# Patient Record
Sex: Female | Born: 1975 | Race: White | Hispanic: No | Marital: Married | Smoking: Never smoker
Health system: Southern US, Community
[De-identification: ages and names within clinical notes are randomized; demographics above are authoritative.]

## PROBLEM LIST (undated history)

## (undated) DIAGNOSIS — N939 Abnormal uterine and vaginal bleeding, unspecified: Secondary | ICD-10-CM

## (undated) DIAGNOSIS — D649 Anemia, unspecified: Secondary | ICD-10-CM

## (undated) DIAGNOSIS — N83209 Unspecified ovarian cyst, unspecified side: Secondary | ICD-10-CM

## (undated) DIAGNOSIS — R42 Dizziness and giddiness: Secondary | ICD-10-CM

## (undated) HISTORY — DX: Abnormal uterine and vaginal bleeding, unspecified: N93.9

## (undated) HISTORY — PX: WISDOM TOOTH EXTRACTION: SHX21

## (undated) HISTORY — DX: Dizziness and giddiness: R42

## (undated) HISTORY — DX: Anemia, unspecified: D64.9

## (undated) HISTORY — PX: ABDOMINAL HYSTERECTOMY: SHX81

---

## 2004-06-06 ENCOUNTER — Ambulatory Visit: Payer: Self-pay | Admitting: General Practice

## 2005-03-27 ENCOUNTER — Ambulatory Visit (HOSPITAL_COMMUNITY): Admission: RE | Admit: 2005-03-27 | Discharge: 2005-03-27 | Payer: Self-pay | Admitting: Obstetrics & Gynecology

## 2006-08-15 ENCOUNTER — Inpatient Hospital Stay (HOSPITAL_COMMUNITY): Admission: AD | Admit: 2006-08-15 | Discharge: 2006-08-18 | Payer: Self-pay | Admitting: Obstetrics & Gynecology

## 2006-08-15 ENCOUNTER — Inpatient Hospital Stay (HOSPITAL_COMMUNITY): Admission: AD | Admit: 2006-08-15 | Discharge: 2006-08-15 | Payer: Self-pay | Admitting: Obstetrics and Gynecology

## 2017-06-18 ENCOUNTER — Encounter (HOSPITAL_COMMUNITY): Payer: Self-pay | Admitting: Emergency Medicine

## 2017-06-18 DIAGNOSIS — R42 Dizziness and giddiness: Secondary | ICD-10-CM | POA: Insufficient documentation

## 2017-06-18 LAB — I-STAT BETA HCG BLOOD, ED (MC, WL, AP ONLY)

## 2017-06-18 LAB — CBC
HCT: 39.4 % (ref 36.0–46.0)
Hemoglobin: 12.9 g/dL (ref 12.0–15.0)
MCH: 27.4 pg (ref 26.0–34.0)
MCHC: 32.7 g/dL (ref 30.0–36.0)
MCV: 83.8 fL (ref 78.0–100.0)
Platelets: 301 10*3/uL (ref 150–400)
RBC: 4.7 MIL/uL (ref 3.87–5.11)
RDW: 14.1 % (ref 11.5–15.5)
WBC: 11.9 10*3/uL — ABNORMAL HIGH (ref 4.0–10.5)

## 2017-06-18 LAB — URINALYSIS, ROUTINE W REFLEX MICROSCOPIC
BACTERIA UA: NONE SEEN
Bilirubin Urine: NEGATIVE
GLUCOSE, UA: NEGATIVE mg/dL
Ketones, ur: NEGATIVE mg/dL
Leukocytes, UA: NEGATIVE
NITRITE: NEGATIVE
PROTEIN: NEGATIVE mg/dL
SPECIFIC GRAVITY, URINE: 1.014 (ref 1.005–1.030)
pH: 7 (ref 5.0–8.0)

## 2017-06-18 LAB — BASIC METABOLIC PANEL
Anion gap: 14 (ref 5–15)
BUN: 15 mg/dL (ref 6–20)
CALCIUM: 9.7 mg/dL (ref 8.9–10.3)
CO2: 24 mmol/L (ref 22–32)
CREATININE: 0.85 mg/dL (ref 0.44–1.00)
Chloride: 103 mmol/L (ref 101–111)
GFR calc Af Amer: >60 mL/min (ref >60)
GLUCOSE: 110 mg/dL — AB (ref 65–99)
Potassium: 3.6 mmol/L (ref 3.5–5.1)
Sodium: 141 mmol/L (ref 135–145)

## 2017-06-18 NOTE — ED Triage Notes (Signed)
Patient here from home with complaints of intermittent "dizzy spells". Also reports nausea, vomiting during this time. Denies ear pain and chest pain.

## 2017-06-18 NOTE — ED Notes (Signed)
EKG given to EDP,Allen,MD., for review. 

## 2017-06-19 ENCOUNTER — Emergency Department (HOSPITAL_COMMUNITY)
Admission: EM | Admit: 2017-06-19 | Discharge: 2017-06-19 | Disposition: A | Discharge status: Home / Self Care | Payer: Self-pay | Attending: Emergency Medicine | Admitting: Emergency Medicine

## 2017-06-19 DIAGNOSIS — R42 Dizziness and giddiness: Secondary | ICD-10-CM

## 2017-06-19 MED ORDER — MECLIZINE HCL 25 MG PO TABS: 25.0000 mg | ORAL_TABLET | Freq: Three times a day (TID) | ORAL | 0 refills | Status: DC | PRN

## 2017-06-19 NOTE — Discharge Instructions (Addendum)
Meclizine as prescribed as needed for dizziness.  Follow-up with your primary doctor if not improving in the next week, and return to the ER if symptoms significantly worsen or change.

## 2017-06-19 NOTE — ED Provider Notes (Signed)
[facility] DEPT Provider Note   CSN: [id number] Arrival date & time: 06/18/17  1839     History   Chief Complaint Chief Complaint  Patient presents with  . Dizziness    HPI Robin Meadows is a 42 y.o. female.  Patient is a 42 year old female with no significant past medical history.  She presents today for evaluation of dizziness.  This is been ongoing for the past 2 days.  Her dizziness is described as a spinning sensation and occurs intermittently.  It does change with change in position and turning her head.  She denies any chest pain or palpitations.  The history is provided by the patient.  Dizziness  Quality:  Head spinning Severity:  Moderate Onset quality:  Sudden Duration:  2 days Timing:  Intermittent Progression:  Unchanged Chronicity:  New Context: bending over and head movement   Relieved by:  Nothing Worsened by:  Turning head and movement Ineffective treatments:  Being still   History reviewed. No pertinent past medical history.  There are no active problems to display for this patient.   History reviewed. No pertinent surgical history.   OB History   None      Home Medications    Prior to Admission medications   Not on File    Family History No family history on file.  Social History Social History   Tobacco Use  . Smoking status: Never Smoker  . Smokeless tobacco: Never Used  Substance Use Topics  . Alcohol use: Never    Frequency: Never  . Drug use: Never     Allergies   Patient has no allergy information on record.   Review of Systems Review of Systems  Neurological: Positive for dizziness.  All other systems reviewed and are negative.    Physical Exam Updated Vital Signs BP 132/68 (BP Location: Right Arm)   Pulse 77   Temp 98.5 F (36.9 C) (Oral)   Resp 16   SpO2 100%   Physical Exam  Constitutional: She is oriented to person, place, and time. She appears  well-developed and well-nourished. No distress.  HENT:  Head: Normocephalic and atraumatic.  Mouth/Throat: Oropharynx is clear and moist.  TMs are clear bilaterally.  Neck: Normal range of motion. Neck supple.  Cardiovascular: Normal rate and regular rhythm. Exam reveals no gallop and no friction rub.  No murmur heard. Pulmonary/Chest: Effort normal and breath sounds normal. No respiratory distress. She has no wheezes.  Abdominal: Soft. Bowel sounds are normal. She exhibits no distension. There is no tenderness.  Musculoskeletal: Normal range of motion.  Lymphadenopathy:    She has no cervical adenopathy.  Neurological: She is alert and oriented to person, place, and time. No cranial nerve deficit. She exhibits normal muscle tone. Coordination normal.  Skin: Skin is warm and dry. She is not diaphoretic.  Nursing note and vitals reviewed.    ED Treatments / Results  Labs (all labs ordered are listed, but only abnormal results are displayed) Labs Reviewed  BASIC METABOLIC PANEL - Abnormal; Notable for the following components:      Result Value   Glucose, Bld 110 (*)    All other components within normal limits  CBC - Abnormal; Notable for the following components:   WBC 11.9 (*)    All other components within normal limits  URINALYSIS, ROUTINE W REFLEX MICROSCOPIC - Abnormal; Notable for the following components:   Hgb urine dipstick MODERATE (*)    Squamous Epithelial /  LPF 0-5 (*)    All other components within normal limits  I-STAT BETA HCG BLOOD, ED ([facility], [facility], [facility] ONLY)  CBG MONITORING, ED    EKG EKG Interpretation  Date/Time:  Tuesday June 18 2017 19:35:14 EDT Ventricular Rate:  83 PR Interval:    QRS Duration: 91 QT Interval:  365 QTC Calculation: 429 R Axis:   70 Text Interpretation:  Sinus rhythm Low voltage, precordial leads Baseline wander in lead(s) V2 Confirmed by Veryl Speak [phone]) on 06/19/2017 12:43:35 AM   Radiology No results  found.  Procedures Procedures (including critical care time)  Medications Ordered in ED Medications - No data to display   Initial Impression / Assessment and Plan / ED Course  I have reviewed the triage vital signs and the nursing notes.  Pertinent labs & imaging results that were available during my care of the patient were reviewed by me and considered in my medical decision making (see chart for details).  Patient presents with dizziness that I suspect is related to a peripheral vertigo.  Her symptoms are worse with movement and change in position and relieved with rest.  Her physical examination is unremarkable and laboratory studies are essentially normal.  I see no indication for imaging studies.  She will be treated with meclizine and follow-up as needed.  Final Clinical Impressions(s) / ED Diagnoses   Final diagnoses:  None    ED Discharge Orders    None       Veryl Speak, MD 06/19/17 [phone]

## 2019-05-10 ENCOUNTER — Encounter (HOSPITAL_COMMUNITY): Payer: Self-pay | Admitting: Emergency Medicine

## 2019-05-10 ENCOUNTER — Emergency Department (HOSPITAL_COMMUNITY): Payer: Self-pay

## 2019-05-10 ENCOUNTER — Other Ambulatory Visit: Payer: Self-pay

## 2019-05-10 ENCOUNTER — Emergency Department (HOSPITAL_COMMUNITY)
Admission: EM | Admit: 2019-05-10 | Discharge: 2019-05-10 | Disposition: A | Discharge status: Home / Self Care | Payer: Self-pay | Attending: Emergency Medicine | Admitting: Emergency Medicine

## 2019-05-10 DIAGNOSIS — N939 Abnormal uterine and vaginal bleeding, unspecified: Secondary | ICD-10-CM | POA: Insufficient documentation

## 2019-05-10 DIAGNOSIS — R1032 Left lower quadrant pain: Secondary | ICD-10-CM

## 2019-05-10 DIAGNOSIS — R102 Pelvic and perineal pain: Secondary | ICD-10-CM

## 2019-05-10 HISTORY — DX: Unspecified ovarian cyst, unspecified side: N83.209

## 2019-05-10 LAB — COMPREHENSIVE METABOLIC PANEL
ALT: 7 U/L (ref 0–44)
AST: 16 U/L (ref 15–41)
Albumin: 3.3 g/dL — ABNORMAL LOW (ref 3.5–5.0)
Alkaline Phosphatase: 80 U/L (ref 38–126)
Anion gap: 12 (ref 5–15)
BUN: 12 mg/dL (ref 6–20)
CO2: 22 mmol/L (ref 22–32)
Calcium: 9.2 mg/dL (ref 8.9–10.3)
Chloride: 104 mmol/L (ref 98–111)
Creatinine, Ser: 0.88 mg/dL (ref 0.44–1.00)
GFR calc Af Amer: >60 mL/min (ref >60)
GFR calc non Af Amer: >60 mL/min (ref >60)
Glucose, Bld: 119 mg/dL — ABNORMAL HIGH (ref 70–99)
Potassium: 3.5 mmol/L (ref 3.5–5.1)
Sodium: 138 mmol/L (ref 135–145)
Total Bilirubin: 0.6 mg/dL (ref 0.3–1.2)
Total Protein: 7.1 g/dL (ref 6.5–8.1)

## 2019-05-10 LAB — POC URINE PREG, ED: Preg Test, Ur: NEGATIVE

## 2019-05-10 LAB — CBC WITH DIFFERENTIAL/PLATELET
Abs Immature Granulocytes: 0.06 10*3/uL (ref 0.00–0.07)
Basophils Absolute: 0.1 10*3/uL (ref 0.0–0.1)
Basophils Relative: 1 %
Eosinophils Absolute: 0.3 10*3/uL (ref 0.0–0.5)
Eosinophils Relative: 3 %
HCT: 33.1 % — ABNORMAL LOW (ref 36.0–46.0)
Hemoglobin: 10 g/dL — ABNORMAL LOW (ref 12.0–15.0)
Immature Granulocytes: 1 %
Lymphocytes Relative: 22 %
Lymphs Abs: 2.4 10*3/uL (ref 0.7–4.0)
MCH: 24 pg — ABNORMAL LOW (ref 26.0–34.0)
MCHC: 30.2 g/dL (ref 30.0–36.0)
MCV: 79.6 fL — ABNORMAL LOW (ref 80.0–100.0)
Monocytes Absolute: 0.4 10*3/uL (ref 0.1–1.0)
Monocytes Relative: 4 %
Neutro Abs: 7.8 10*3/uL — ABNORMAL HIGH (ref 1.7–7.7)
Neutrophils Relative %: 69 %
Platelets: 292 10*3/uL (ref 150–400)
RBC: 4.16 MIL/uL (ref 3.87–5.11)
RDW: 14.1 % (ref 11.5–15.5)
WBC: 11.1 10*3/uL — ABNORMAL HIGH (ref 4.0–10.5)
nRBC: 0 % (ref 0.0–0.2)

## 2019-05-10 LAB — WET PREP, GENITAL
Sperm: NONE SEEN
Trich, Wet Prep: NONE SEEN
Yeast Wet Prep HPF POC: NONE SEEN

## 2019-05-10 NOTE — ED Triage Notes (Signed)
Pt reports vaginal bleeding x 2 months.  Lower back and generalized abd pain x 1 week.  Seen at [facility] and diagnosed with L ovarian cyst.

## 2019-05-10 NOTE — ED Notes (Signed)
Patient verbalizes understanding of discharge instructions. Opportunity for questioning and answers were provided. Armband removed by staff, pt discharged from ED ambulatory to home.  

## 2019-05-10 NOTE — Discharge Instructions (Signed)
Please read and follow all provided instructions.  Your diagnoses today include:  1. Vaginal bleeding   2. LLQ pain   3. Pelvic pain     Tests performed today include:  Hemoglobin was a little low  Ultrasound -again shows thickened endometrium, no ovarian cyst today  Vital signs. See below for your results today.   Medications prescribed:   None  Take any prescribed medications only as directed.  Home care instructions:  Follow any educational materials contained in this packet.  BE VERY CAREFUL not to take multiple medicines containing Tylenol (also called acetaminophen). Doing so can lead to an overdose which can damage your liver and cause liver failure and possibly death.   Follow-up instructions: Please follow-up with the OB/GYN listed.  Call tomorrow and schedule appointment.  Return instructions:   Please return to the Emergency Department if you experience worsening symptoms.   Return with heavier bleeding, chest pain, shortness of breath, lightheadedness or if you pass out.  Please return if you have any other emergent concerns.  Additional Information:  Your vital signs today were: BP 124/63   Pulse 79   Temp 98.5 F (36.9 C) (Oral)   Resp 16   Ht 5\' 11"  (1.803 m)   Wt (!) 142.9 kg   LMP 03/09/2019   SpO2 100%   BMI 43.93 kg/m  If your blood pressure (BP) was elevated above 135/85 this visit, please have this repeated by your doctor within one month. --------------

## 2019-05-10 NOTE — ED Notes (Signed)
Patient transported to Ultrasound 

## 2019-05-10 NOTE — ED Provider Notes (Signed)
[facility] EMERGENCY DEPARTMENT Provider Note   CSN: [id number] Arrival date & time: 05/10/19  1019     History Chief Complaint  Patient presents with  . Vaginal Bleeding  . Abdominal Pain    Robin Meadows is a 44 y.o. female.  Patient with no previous surgical history presents the emergency department with complaint of ongoing vaginal bleeding and abdominal pain.  Patient states that she has had irregular periods, bleeding twice a month, for about 10 months.  She has had 2 months of persistent daily bleeding.  She has followed up with her PCP at [facility] and states that she had a growth on the left ovary as well as a thickened endometrium on ultrasound.  Plan was to follow-up with OB/GYN and patient was referred.  She states that she has not heard back regarding follow-up for about 3 weeks.  She was placed on 10 days of progesterone, finished about a week ago.  This helped slow the bleeding but it is not stopped.  Currently she is replacing a small pad every 2-3 hours.  No lightheadedness, syncope, shortness of breath or chest pain.  She does have some fatigue.  Recent negative pregnancy test.  Patient has also developed some lower back pain and mainly left-sided abdominal pain over the past week.  This is exacerbated by eating.  In addition, patient noticed some right-sided abdominal pain in the past day.        Past Medical History:  Diagnosis Date  . Ovarian cyst     There are no problems to display for this patient.   History reviewed. No pertinent surgical history.   OB History   No obstetric history on file.     No family history on file.  Social History   Tobacco Use  . Smoking status: Never Smoker  . Smokeless tobacco: Never Used  Substance Use Topics  . Alcohol use: Never  . Drug use: Never    Home Medications Prior to Admission medications   Medication Sig Start Date End Date Taking? Authorizing Provider    meclizine (ANTIVERT) 25 MG tablet Take 1 tablet (25 mg total) by mouth 3 (three) times daily as needed for dizziness. 06/19/17   Veryl Speak, MD    Allergies    Patient has no allergy information on record.  Review of Systems   Review of Systems  Constitutional: Positive for fatigue. Negative for fever.  HENT: Negative for rhinorrhea and sore throat.   Eyes: Negative for redness.  Respiratory: Negative for cough and shortness of breath.   Cardiovascular: Negative for chest pain.  Gastrointestinal: Positive for abdominal pain. Negative for diarrhea, nausea and vomiting.  Genitourinary: Positive for vaginal bleeding. Negative for dysuria and vaginal discharge.  Musculoskeletal: Negative for myalgias.  Skin: Negative for rash.  Neurological: Negative for headaches.    Physical Exam Updated Vital Signs BP (!) 122/54   Pulse 88   Temp 98.5 F (36.9 C) (Oral)   Resp 16   Ht 5\' 11"  (1.803 m)   Wt (!) 142.9 kg   LMP 03/09/2019   SpO2 100%   BMI 43.93 kg/m   Physical Exam Vitals and nursing note reviewed.  Constitutional:      Appearance: She is well-developed.  HENT:     Head: Normocephalic and atraumatic.  Eyes:     General:        Right eye: No discharge.        Left eye: No  discharge.     Conjunctiva/sclera: Conjunctivae normal.  Cardiovascular:     Rate and Rhythm: Normal rate and regular rhythm.     Heart sounds: Normal heart sounds.  Pulmonary:     Effort: Pulmonary effort is normal.     Breath sounds: Normal breath sounds.  Abdominal:     Palpations: Abdomen is soft.     Tenderness: There is no abdominal tenderness.  Genitourinary:    Vagina: Bleeding present. No vaginal discharge.     Cervix: No cervical motion tenderness.     Uterus: Normal. Not tender.      Adnexa: Right adnexa normal and left adnexa normal.       Right: No tenderness.         Left: No tenderness.    Musculoskeletal:     Cervical back: Normal range of motion and neck supple.  Skin:     General: Skin is warm and dry.  Neurological:     Mental Status: She is alert.     ED Results / Procedures / Treatments   Labs (all labs ordered are listed, but only abnormal results are displayed) Labs Reviewed  WET PREP, GENITAL - Abnormal; Notable for the following components:      Result Value   Clue Cells Wet Prep HPF POC PRESENT (*)    WBC, Wet Prep HPF POC MANY (*)    All other components within normal limits  CBC WITH DIFFERENTIAL/PLATELET - Abnormal; Notable for the following components:   WBC 11.1 (*)    Hemoglobin 10.0 (*)    HCT 33.1 (*)    MCV 79.6 (*)    MCH 24.0 (*)    Neutro Abs 7.8 (*)    All other components within normal limits  COMPREHENSIVE METABOLIC PANEL - Abnormal; Notable for the following components:   Glucose, Bld 119 (*)    Albumin 3.3 (*)    All other components within normal limits  URINALYSIS, ROUTINE W REFLEX MICROSCOPIC  POC URINE PREG, ED  GC/CHLAMYDIA PROBE AMP ([place]) NOT AT [facility]    EKG None  Radiology US PELVIC COMPLETE W TRANSVAGINAL AND TORSION R/O  Result Date: 05/10/2019 CLINICAL DATA:  Left lower quadrant abdominal pain and pelvic pain for 1 week, vaginal bleeding for 2 months. EXAM: TRANSABDOMINAL AND TRANSVAGINAL ULTRASOUND OF PELVIS DOPPLER ULTRASOUND OF OVARIES TECHNIQUE: Both transabdominal and transvaginal ultrasound examinations of the pelvis were performed. Transabdominal technique was performed for global imaging of the pelvis including uterus, ovaries, adnexal regions, and pelvic cul-de-sac. It was necessary to proceed with endovaginal exam following the transabdominal exam to visualize the adnexa and endometrium. Color and duplex Doppler ultrasound was utilized to evaluate blood flow to the ovaries. COMPARISON:  Hysterosalpingogram dated 03/27/2005 FINDINGS: Uterus Measurements: 14.6 x 7.2 x 9.0 cm = volume: 498 mL. The uterus is enlarged. No fibroids or other mass visualized. Endometrium Thickness: 22 mm.  The  endometrium is thickened. Right ovary Not visualized.  No adnexal mass is identified. Left ovary Measurements: 2.4 x 4.5 x 6.0 = volume: 104 mL. Normal appearance/no adnexal mass. Pulsed Doppler evaluation of both ovaries demonstrates normal low-resistance arterial and venous waveforms. Other findings No abnormal free fluid. IMPRESSION: Endometrial thickness is considered abnormal. Consider follow-up by [place] in 6-8 weeks, during the week immediately following menses (exam timing is critical). Electronically Signed   By: Zerita Boers M.D.   On: 05/10/2019 14:33    Procedures Procedures (including critical care time)  Medications Ordered in ED Medications -  No data to display  ED Course  I have reviewed the triage vital signs and the nursing notes.  Pertinent labs & imaging results that were available during my care of the patient were reviewed by me and considered in my medical decision making (see chart for details).  Patient seen and examined.   Vital signs reviewed and are as follows: BP (!) 122/54   Pulse 88   Temp 98.5 F (36.9 C) (Oral)   Resp 16   Ht 5\' 11"  (1.803 m)   Wt (!) 142.9 kg   LMP 03/09/2019   SpO2 100%   BMI 43.93 kg/m   Pelvic exam performed. I do not have imaging or records from [place].   Given reported abnormalities -- will repeat US here today.  Hemoglobin is reassuring.  Orthostatic VS for the past 24 hrs:  BP- Lying Pulse- Lying BP- Sitting Pulse- Sitting BP- Standing at 0 minutes Pulse- Standing at 0 minutes  05/10/19 1110 119/71 89 133/86 104 132/78 90    3:17 PM [place] with thickened endometrium. No ovarian issues, right side not visible, no mass.   The patient was urged to return to the Emergency Department immediately with worsening of current symptoms, worsening abdominal pain, persistent vomiting, blood noted in stools, fever, or any other concerns. The patient verbalized understanding.      MDM Rules/Calculators/A&P                      Patient  with ongoing vaginal bleeding, abdominal pain.  Neg preg.  Hemoglobin is 10.  Mild bleeding on exam.  Ultrasound with thickened endometrium, no ovarian findings today.  Patient will need follow-up with OB/GYN.  Referral given.  Non-focal abd pain at time of exam today. WBC 11.1. Vitals are stable, no fever. No signs of dehydration, patient is tolerating PO's. Lungs are clear and no signs suggestive of PNA. Low concern for appendicitis, cholecystitis, pancreatitis, ruptured viscus, UTI, kidney stone, aortic dissection, aortic aneurysm or other emergent abdominal etiology. Supportive therapy indicated with return if symptoms worsen.    Final Clinical Impression(s) / ED Diagnoses Final diagnoses:  Vaginal bleeding    Rx / DC Orders ED Discharge Orders    None       Carlisle Cater, PA-C 05/10/19 1520    Lucrezia Starch, MD 05/12/19 [phone]

## 2019-05-12 LAB — GC/CHLAMYDIA PROBE AMP (~~LOC~~) NOT AT ARMC
Chlamydia: NEGATIVE
Neisseria Gonorrhea: NEGATIVE

## 2019-05-19 ENCOUNTER — Ambulatory Visit (INDEPENDENT_AMBULATORY_CARE_PROVIDER_SITE_OTHER): Payer: BC Managed Care – PPO | Admitting: Obstetrics and Gynecology

## 2019-05-19 ENCOUNTER — Encounter: Payer: Self-pay | Admitting: Obstetrics and Gynecology

## 2019-05-19 ENCOUNTER — Other Ambulatory Visit: Payer: Self-pay

## 2019-05-19 ENCOUNTER — Other Ambulatory Visit (HOSPITAL_COMMUNITY)
Admission: RE | Admit: 2019-05-19 | Discharge: 2019-05-19 | Disposition: A | Discharge status: Home / Self Care | Payer: BC Managed Care – PPO | Source: Ambulatory Visit | Attending: Obstetrics and Gynecology | Admitting: Obstetrics and Gynecology

## 2019-05-19 VITALS — BP 122/80 | HR 96 | Temp 97.0°F | Resp 16 | Ht 69.0 in | Wt 311.8 lb

## 2019-05-19 DIAGNOSIS — R9389 Abnormal findings on diagnostic imaging of other specified body structures: Secondary | ICD-10-CM | POA: Diagnosis not present

## 2019-05-19 DIAGNOSIS — Z124 Encounter for screening for malignant neoplasm of cervix: Secondary | ICD-10-CM | POA: Diagnosis not present

## 2019-05-19 DIAGNOSIS — N939 Abnormal uterine and vaginal bleeding, unspecified: Secondary | ICD-10-CM | POA: Diagnosis not present

## 2019-05-19 DIAGNOSIS — N83202 Unspecified ovarian cyst, left side: Secondary | ICD-10-CM | POA: Diagnosis not present

## 2019-05-19 MED ORDER — METRONIDAZOLE 500 MG PO TABS: 500.0000 mg | ORAL_TABLET | Freq: Two times a day (BID) | ORAL | 0 refills | Status: DC

## 2019-05-19 NOTE — Progress Notes (Signed)
44 y.o. G50P1021 Married Caucasian female here for abnormal uterine bleeding and abnormal PUS.    States menstrual bleeding since April 2020, has occurred every other week.  Patient has been on menses continually since 02-19-19.  Had PUS 05-10-19 ordered by [facility] showing possible ovarian cyst and endometrial thickening. Her TSH was 2.114, Hgb 9.8, ferritin 6.5. Patient received course of progesterone, which helped the bleeding, but did not stop it.  Last Provera was 04/30/19. She has had clotting. As of yesterday is now spotting only.   She developed lower abdominal and back pain 05/08/19 in addition to the ongoing bleeding, and this prompted her visit to [facility] ER on 05/10/19.  States she took Pamprin for the pain.  Her hemoglobin was 10.0.  She started iron 65 mg for the anemia, which has helped the fatigue and sleepiness. Neg GC/CT. She tested positive for bacterial vaginosis and did not have treatment.  Negative UPT.   Pelvic US at the hospital showed:  CLINICAL DATA:  Left lower quadrant abdominal pain and pelvic pain for 1 week, vaginal bleeding for 2 months.  EXAM: TRANSABDOMINAL AND TRANSVAGINAL ULTRASOUND OF PELVIS  DOPPLER ULTRASOUND OF OVARIES  TECHNIQUE: Both transabdominal and transvaginal ultrasound examinations of the pelvis were performed. Transabdominal technique was performed for global imaging of the pelvis including uterus, ovaries, adnexal regions, and pelvic cul-de-sac.  It was necessary to proceed with endovaginal exam following the transabdominal exam to visualize the adnexa and endometrium. Color and duplex Doppler ultrasound was utilized to evaluate blood flow to the ovaries.  COMPARISON:  Hysterosalpingogram dated 03/27/2005  FINDINGS: Uterus  Measurements: 14.6 x 7.2 x 9.0 cm = volume: 498 mL. The uterus is enlarged. No fibroids or other mass visualized.  Endometrium  Thickness: 22 mm.  The endometrium is  thickened.  Right ovary  Not visualized.  No adnexal mass is identified.  Left ovary  Measurements: 2.4 x 4.5 x 6.0 = volume: 104 mL. Normal appearance/no adnexal mass.  Pulsed Doppler evaluation of both ovaries demonstrates normal low-resistance arterial and venous waveforms.  Other findings  No abnormal free fluid.  IMPRESSION: Endometrial thickness is considered abnormal. Consider follow-up by [place] in 6-8 weeks, during the week immediately following menses (exam timing is critical).   Electronically Signed   By: Zerita Boers M.D.   On: 05/10/2019 14:33  Lost her job with the pandemic.  Has a 36 yo daughter.  Declines future childbearing.  Last intercourse was prior to April 23, 2019.   PCP: [facility]   Patient's last menstrual period was 02/19/2019 (exact date).           Sexually active: Yes.    The current method of family planning is none.    Exercising: No.  The patient does not participate in regular exercise at present. Smoker:  no  Health Maintenance: Pap:  2019 normal per patient History of abnormal Pap:  no MMG: In her 30's thru mobile unit--normal Colonoscopy:  n/a BMD:   n/a  Result  n/a TDaP: within last 10 years Gardasil:   no HIV: neg in preg Hep C:no     reports that she has never smoked. She has never used smokeless tobacco. She reports that she does not drink alcohol or use drugs.  Past Medical History:  Diagnosis Date  . Abnormal uterine bleeding   . Anemia   . Ovarian cyst     History reviewed. No pertinent surgical history.  Current Outpatient Medications  Medication Sig  Dispense Refill  . medroxyPROGESTERone (PROVERA) 10 MG tablet Take 10 mg by mouth daily.     No current facility-administered medications for this visit.    Family History  Problem Relation Age of Onset  . Cancer Mother        ?cervical ca--dec age 59  . Cancer Father        dec with colon ca age 56  . Hypertension  Maternal Grandfather   . Cancer Maternal Grandfather        dec Lung cancer  . Diabetes Maternal Grandmother     Review of Systems  Constitutional:       Low back pain  Genitourinary: Positive for pelvic pain (LLQ).  All other systems reviewed and are negative.   Exam:   BP 122/80 (Cuff Size: Large)   Pulse 96   Temp (!) 97 F (36.1 C) (Temporal)   Resp 16   Ht 5\' 9"  (1.753 m)   Wt (!) 311 lb 12.8 oz (141.4 kg)   LMP 02/19/2019 (Exact Date)   BMI 46.04 kg/m     General appearance: alert, cooperative and appears stated age Head: normocephalic, without obvious abnormality, atraumatic Neck: no adenopathy, supple, symmetrical, trachea midline and thyroid normal to inspection and palpation Lungs: clear to auscultation bilaterally Breasts: normal appearance, no masses or tenderness, No nipple retraction or dimpling, No nipple discharge or bleeding, No axillary adenopathy Heart: regular rate and rhythm Abdomen: soft, non-tender; no masses, no organomegaly Extremities: extremities normal, atraumatic, no cyanosis or edema Skin: skin color, texture, turgor normal. No rashes or lesions Lymph nodes: cervical, supraclavicular, and axillary nodes normal. Neurologic: grossly normal  Pelvic: External genitalia:  no lesions              No abnormal inguinal nodes palpated.              Urethra:  normal appearing urethra with no masses, tenderness or lesions              Bartholins and Skenes: normal                 Vagina: normal appearing vagina with normal color and discharge, no lesions              Cervix: no lesions.  No active bleeding from the cervix.               Pap taken: No. Bimanual Exam:  Uterus:  normal size, contour, position, consistency, mobility, non-tender              Adnexa: no mass, fullness, tenderness on right.  Left adnexal tenderness and fullness.              Rectal exam: Yes.  .  Confirms.              Anus:  normal sphincter tone, no lesions  Chaperone was  present for exam.  Assessment:    Left ovarian cyst on review of imaging from hospital. Abnormal uterine bleeding.  Thickened endometrium.  Untreated BV.  FH colon cancer.  FH cervical cancer?  Plan:   We reviewed abnormal uterine bleeding and potential etiologies - polyps, hyperplasia, malignancy.  Return for sonohysterogram and EMB.  She will abstain from intercourse until this is completed as she is not using contraception. Final plan to follow.  Check CA125 and CEA. Pap and HR HPV testing.  Flagyl 500 mg po bid x 7 days.   She needs to complete this prior  to sonohysterogram and EMB. She will schedule a mammogram.   ____45___ minutes face to face time of which over 50% was spent in counseling.

## 2019-05-20 LAB — CYTOLOGY - PAP
Comment: NEGATIVE
Diagnosis: NEGATIVE
High risk HPV: NEGATIVE

## 2019-05-20 LAB — CEA: CEA: 0.5 ng/mL (ref 0.0–4.7)

## 2019-05-20 LAB — CA 125: Cancer Antigen (CA) 125: 13.3 U/mL (ref 0.0–38.1)

## 2019-05-21 ENCOUNTER — Telehealth: Payer: Self-pay | Admitting: Obstetrics and Gynecology

## 2019-05-21 NOTE — Telephone Encounter (Signed)
Call to patient. Per DPR, OK to leave message on voicemail.   Left voicemail requesting a return call to [name] to review benefits and schedule recommended Sonohysterogram with Brook A. Silva, MD, FACOG. 

## 2019-05-22 NOTE — Telephone Encounter (Signed)
Patient is returning a call to [name]. °

## 2019-05-22 NOTE — Telephone Encounter (Signed)
Spoke with patient regarding benefits for recommended Sonohysterogram. Patient acknowledges understanding of information presented. Patient is aware of cancellation policy. Encounter closed. °

## 2019-05-25 ENCOUNTER — Other Ambulatory Visit: Payer: Self-pay

## 2019-05-28 ENCOUNTER — Other Ambulatory Visit: Payer: BC Managed Care – PPO | Admitting: Obstetrics and Gynecology

## 2019-05-28 ENCOUNTER — Encounter: Payer: Self-pay | Admitting: Obstetrics and Gynecology

## 2019-05-28 ENCOUNTER — Other Ambulatory Visit (HOSPITAL_COMMUNITY)
Admission: RE | Admit: 2019-05-28 | Discharge: 2019-05-28 | Disposition: A | Discharge status: Home / Self Care | Payer: BC Managed Care – PPO | Source: Ambulatory Visit | Attending: Obstetrics and Gynecology | Admitting: Obstetrics and Gynecology

## 2019-05-28 ENCOUNTER — Ambulatory Visit (INDEPENDENT_AMBULATORY_CARE_PROVIDER_SITE_OTHER): Payer: BC Managed Care – PPO

## 2019-05-28 ENCOUNTER — Ambulatory Visit: Payer: BC Managed Care – PPO | Admitting: Obstetrics and Gynecology

## 2019-05-28 ENCOUNTER — Other Ambulatory Visit: Payer: Self-pay | Admitting: Obstetrics and Gynecology

## 2019-05-28 ENCOUNTER — Other Ambulatory Visit: Payer: BC Managed Care – PPO

## 2019-05-28 ENCOUNTER — Other Ambulatory Visit: Payer: Self-pay

## 2019-05-28 VITALS — BP 122/78 | HR 80 | Temp 97.2°F | Ht 69.0 in | Wt 311.0 lb

## 2019-05-28 DIAGNOSIS — N840 Polyp of corpus uteri: Secondary | ICD-10-CM | POA: Diagnosis not present

## 2019-05-28 DIAGNOSIS — R9389 Abnormal findings on diagnostic imaging of other specified body structures: Secondary | ICD-10-CM

## 2019-05-28 DIAGNOSIS — N939 Abnormal uterine and vaginal bleeding, unspecified: Secondary | ICD-10-CM | POA: Diagnosis not present

## 2019-05-28 DIAGNOSIS — N83202 Unspecified ovarian cyst, left side: Secondary | ICD-10-CM

## 2019-05-28 NOTE — Progress Notes (Signed)
GYNECOLOGY  VISIT   HPI: 44 y.o.   Married  Caucasian  female   [phone] with Patient's last menstrual period was 02/19/2019 (approximate).   here for sonohysterogram and EMB.   Patient has ongoing vaginal bleeding, thickened endometrium, and a left ovarian cyst.  She has had continual bleeding since 02/19/19. She had a course of Provera which did not help the bleeding.  Hgb 10.0 on 05/10/19. Her CA125 is 13.3 and CEA 0.5.  She was just treated for BV with a course of Flagyl.   GYNECOLOGIC HISTORY: Patient's last menstrual period was 02/19/2019 (approximate). Contraception: None Menopausal hormone therapy:  n/a Last mammogram:  In her 29's thru mobile unit--normal Last pap smear: 05-19-19 Neg:Neg HR HPV, 2019 normal per patient        OB History    Gravida  3   Para  1   Term  1   Preterm      AB  2   Living  1     SAB  2   TAB      Ectopic      Multiple      Live Births                 There are no problems to display for this patient.   Past Medical History:  Diagnosis Date  . Abnormal uterine bleeding   . Anemia   . Ovarian cyst     History reviewed. No pertinent surgical history.  Current Outpatient Medications  Medication Sig Dispense Refill  . medroxyPROGESTERone (PROVERA) 10 MG tablet Take 10 mg by mouth daily.     No current facility-administered medications for this visit.     ALLERGIES: Patient has no known allergies.  Family History  Problem Relation Age of Onset  . Cancer Mother        ?cervical ca--dec age 94  . Cancer Father        dec with colon ca age 107  . Hypertension Maternal Grandfather   . Cancer Maternal Grandfather        dec Lung cancer  . Diabetes Maternal Grandmother     Social History   Socioeconomic History  . Marital status: Married    Spouse name: Not on file  . Number of children: Not on file  . Years of education: Not on file  . Highest education level: Not on file  Occupational History  . Not on  file  Tobacco Use  . Smoking status: Never Smoker  . Smokeless tobacco: Never Used  Substance and Sexual Activity  . Alcohol use: Never  . Drug use: Never  . Sexual activity: Yes    Birth control/protection: None  Other Topics Concern  . Not on file  Social History Narrative  . Not on file   Social Determinants of Health   Financial Resource Strain:   . Difficulty of Paying Living Expenses:   Food Insecurity:   . Worried About Charity fundraiser in the Last Year:   . Arboriculturist in the Last Year:   Transportation Needs:   . Film/video editor (Medical):   Marland Kitchen Lack of Transportation (Non-Medical):   Physical Activity:   . Days of Exercise per Week:   . Minutes of Exercise per Session:   Stress:   . Feeling of Stress :   Social Connections:   . Frequency of Communication with Friends and Family:   . Frequency of Social Gatherings with Friends and  Family:   . Attends Religious Services:   . Active Member of Clubs or Organizations:   . Attends Archivist Meetings:   Marland Kitchen Marital Status:   Intimate Partner Violence:   . Fear of Current or Ex-Partner:   . Emotionally Abused:   Marland Kitchen Physically Abused:   . Sexually Abused:     Review of Systems  All other systems reviewed and are negative.   PHYSICAL EXAMINATION:    BP 122/78 (Cuff Size: Large)   Pulse 80   Temp (!) 97.2 F (36.2 C) (Temporal)   Ht 5\' 9"  (1.753 m)   Wt (!) 311 lb (141.1 kg)   LMP 02/19/2019 (Approximate)   BMI 45.93 kg/m     General appearance: alert, cooperative and appears stated age   Consent for procedures.   Pelvic US Uterus with EMS 10.73, suggestive of mass.  No myometrial masses.  Left ovary with 2 cysts, 5 cm and 3 cm, suggestion of septation.  Right ovary normal  No free fluid.   Sonohysterogram.  Sterile prep with Hibiclens.  Canula placed and NS injected. 22 mm filling defect in lower uterine segment.   EMS Sterile prep with Hibiclens.  Pipelle passed twice,  once to 7.5 cm and once to 10 cm.  Tissue to pathology.  No complications. Minimal EBL.  Chaperone was present for exam.  ASSESSMENT  Abnormal uterine bleeding.  Endometrial mass.  Left ovarian cyst.  Anemia.    PLAN  Taking Fe 65 mg daily.  Fu EMB. Precautions given.  We had an extensive discussion of tx options - hysteroscopy with dilation and curettage and Myosure resection of endometrial mass with laparoscopic left oophorectomy versus total laparoscopic hysterectomy with bilateral salpingectomy, left oophorectomy, collection of pelvic washings.   ACOG HO on hysteroscopy, dilation and curettage, and hysterectomy.    An After Visit Summary was printed and given to the patient.  __25____ minutes face to face time of which over 50% was spent in counseling regarding treatment choices.

## 2019-05-28 NOTE — Patient Instructions (Addendum)
Endometrial Biopsy, Care After This sheet gives you information about how to care for yourself after your procedure. Your health care provider may also give you more specific instructions. If you have problems or questions, contact your health care provider. What can I expect after the procedure? After the procedure, it is common to have:  Mild cramping.  A small amount of vaginal bleeding for a few days. This is normal. Follow these instructions at home:   Take over-the-counter and prescription medicines only as told by your health care provider.  Do not douche, use tampons, or have sexual intercourse until your health care provider approves.  Return to your normal activities as told by your health care provider. Ask your health care provider what activities are safe for you.  Follow instructions from your health care provider about any activity restrictions, such as restrictions on strenuous exercise or heavy lifting. Contact a health care provider if:  You have heavy bleeding, or bleed for longer than 2 days after the procedure.  You have bad smelling discharge from your vagina.  You have a fever or chills.  You have a burning sensation when urinating or you have difficulty urinating.  You have severe pain in your lower abdomen. Get help right away if:  You have severe cramps in your stomach or back.  You pass large blood clots.  Your bleeding increases.  You become weak or light-headed, or you pass out. Summary  After the procedure, it is common to have mild cramping and a small amount of vaginal bleeding for a few days.  Do not douche, use tampons, or have sexual intercourse until your health care provider approves.  Return to your normal activities as told by your health care provider. Ask your health care provider what activities are safe for you. This information is not intended to replace advice given to you by your health care provider. Make sure you discuss any  questions you have with your health care provider. Document Revised: 02/15/2017 Document Reviewed: 03/21/2016 Elsevier Patient Education  [employer].  Ovarian Cyst     An ovarian cyst is a fluid-filled sac that forms on an ovary. The ovaries are small organs that produce eggs in women. Various types of cysts can form on the ovaries. Some may cause symptoms and require treatment. Most ovarian cysts go away on their own, are not cancerous (are benign), and do not cause problems. Common types of ovarian cysts include:  Functional (follicle) cysts. ? Occur during the menstrual cycle, and usually go away with the next menstrual cycle if you do not get pregnant. ? Usually cause no symptoms.  Endometriomas. ? Are cysts that form from the tissue that lines the uterus (endometrium). ? Are sometimes called "chocolate cysts" because they become filled with blood that turns brown. ? Can cause pain in the lower abdomen during intercourse and during your period.  Cystadenoma cysts. ? Develop from cells on the outside surface of the ovary. ? Can get very large and cause lower abdomen pain and pain with intercourse. ? Can cause severe pain if they twist or break open (rupture).  Dermoid cysts. ? Are sometimes found in both ovaries. ? May contain different kinds of body tissue, such as skin, teeth, hair, or cartilage. ? Usually do not cause symptoms unless they get very big.  Theca lutein cysts. ? Occur when too much of a certain hormone (human chorionic gonadotropin) is produced and overstimulates the ovaries to produce an egg. ? Are  most common after having procedures used to assist with the conception of a baby (in vitro fertilization). What are the causes? Ovarian cysts may be caused by:  Ovarian hyperstimulation syndrome. This is a condition that can develop from taking fertility medicines. It causes multiple large ovarian cysts to form.  Polycystic ovarian syndrome (PCOS). This is  a common hormonal disorder that can cause ovarian cysts, as well as problems with your period or fertility. What increases the risk? The following factors may make you more likely to develop ovarian cysts:  Being overweight or obese.  Taking fertility medicines.  Taking certain forms of hormonal birth control.  Smoking. What are the signs or symptoms? Many ovarian cysts do not cause symptoms. If symptoms are present, they may include:  Pelvic pain or pressure.  Pain in the lower abdomen.  Pain during sex.  Abdominal swelling.  Abnormal menstrual periods.  Increasing pain with menstrual periods. How is this diagnosed? These cysts are commonly found during a routine pelvic exam. You may have tests to find out more about the cyst, such as:  Ultrasound.  X-ray of the pelvis.  CT scan.  MRI.  Blood tests. How is this treated? Many ovarian cysts go away on their own without treatment. Your health care provider may want to check your cyst regularly for 2-3 months to see if it changes. If you are in menopause, it is especially important to have your cyst monitored closely because menopausal women have a higher rate of ovarian cancer. When treatment is needed, it may include:  Medicines to help relieve pain.  A procedure to drain the cyst (aspiration).  Surgery to remove the whole cyst.  Hormone treatment or birth control pills. These methods are sometimes used to help dissolve a cyst. Follow these instructions at home:  Take over-the-counter and prescription medicines only as told by your health care provider.  Do not drive or use heavy machinery while taking prescription pain medicine.  Get regular pelvic exams and Pap tests as often as told by your health care provider.  Return to your normal activities as told by your health care provider. Ask your health care provider what activities are safe for you.  Do not use any products that contain nicotine or tobacco,  such as cigarettes and e-cigarettes. If you need help quitting, ask your health care provider.  Keep all follow-up visits as told by your health care provider. This is important. Contact a health care provider if:  Your periods are late, irregular, or painful, or they stop.  You have pelvic pain that does not go away.  You have pressure on your bladder or trouble emptying your bladder completely.  You have pain during sex.  You have any of the following in your abdomen: ? A feeling of fullness. ? Pressure. ? Discomfort. ? Pain that does not go away. ? Swelling.  You feel generally ill.  You become constipated.  You lose your appetite.  You develop severe acne.  You start to have more body hair and facial hair.  You are gaining weight or losing weight without changing your exercise and eating habits.  You think you may be pregnant. Get help right away if:  You have abdominal pain that is severe or gets worse.  You cannot eat or drink without vomiting.  You suddenly develop a fever.  Your menstrual period is much heavier than usual. This information is not intended to replace advice given to you by your health care provider.  Make sure you discuss any questions you have with your health care provider. Document Revised: 06/03/2017 Document Reviewed: 08/07/2015 Elsevier Patient Education  2020 [employer].

## 2019-05-28 NOTE — Progress Notes (Signed)
Encounter reviewed by Dr. Fanny Agan Amundson C. Silva.  

## 2019-05-29 LAB — SURGICAL PATHOLOGY

## 2019-06-01 ENCOUNTER — Telehealth: Payer: Self-pay | Admitting: *Deleted

## 2019-06-01 ENCOUNTER — Encounter: Payer: Self-pay | Admitting: Obstetrics and Gynecology

## 2019-06-01 DIAGNOSIS — R7989 Other specified abnormal findings of blood chemistry: Secondary | ICD-10-CM

## 2019-06-01 NOTE — Telephone Encounter (Signed)
Left message to call Sharee Pimple, RN at [facility].   Please schedule lab for repeat CBC

## 2019-06-01 NOTE — Telephone Encounter (Signed)
Patient returned call. A lab appointment was made for 06/04/19.

## 2019-06-01 NOTE — Telephone Encounter (Signed)
Spoke with patient, advised as seen below per Dr. Quincy Simmonds. Patient request to proceed with hysterectomy. Patient declines OV to further discuss with Dr. Quincy Simmonds. Advised patient I will update Dr. Quincy Simmonds. The business office will be in touch regarding benefits and RN will call to discuss surgery scheduling. Patient verbalizes understanding and is agreeable.   Routing to Dr. Quincy Simmonds.  Cc: [name] Carder, Reesa Chew, RN

## 2019-06-01 NOTE — Telephone Encounter (Signed)
-----   Message from [name], MD sent at 05/30/2019  8:46 AM EST ----- Please contact patient with results of endometrial biopsy showing endometrial polyp.  This will need removal either through hysteroscopy or hysterectomy.   When she was in the office we discussed both options and also removal of her ovary due to her cyst.   Please let me know how she would like to proceed forward.  If she needs to return for further discussion, that is fine also.

## 2019-06-01 NOTE — Telephone Encounter (Signed)
Burnice Logan, RN  06/01/2019 10:25 AM EDT    Call to patient. Mailbox full, unable to leave message.

## 2019-06-01 NOTE — Telephone Encounter (Signed)
Patient returned call

## 2019-06-01 NOTE — Telephone Encounter (Signed)
Patient will need precert for total laparoscopic hysterectomy with bilateral salpingectomy, left oophorectomy, collection of pelvic washings.  She has menorrhagia, anemia, thickened endometrium, endometrial polyp and a left ovarian cyst.   She has started iron for her anemia.  Her CBC will need to be checked again, at least in 2 weeks.   [place], [place]

## 2019-06-03 NOTE — Telephone Encounter (Signed)
Dr. Quincy Simmonds -patient is scheduled for repeat CBC on 3/18. Is this a CBC or CBC with diff?

## 2019-06-03 NOTE — Telephone Encounter (Signed)
Future lab order placed.   Routing to [employer] and Reesa Chew, Therapist, sports

## 2019-06-03 NOTE — Telephone Encounter (Signed)
I prefer CBC with differential.

## 2019-06-04 ENCOUNTER — Ambulatory Visit
Admission: RE | Admit: 2019-06-04 | Discharge: 2019-06-04 | Disposition: A | Discharge status: Home / Self Care | Payer: BC Managed Care – PPO | Source: Ambulatory Visit

## 2019-06-04 ENCOUNTER — Other Ambulatory Visit: Payer: Self-pay | Admitting: Obstetrics and Gynecology

## 2019-06-04 ENCOUNTER — Other Ambulatory Visit: Payer: Self-pay

## 2019-06-04 ENCOUNTER — Other Ambulatory Visit (INDEPENDENT_AMBULATORY_CARE_PROVIDER_SITE_OTHER): Payer: BC Managed Care – PPO

## 2019-06-04 DIAGNOSIS — R7989 Other specified abnormal findings of blood chemistry: Secondary | ICD-10-CM | POA: Diagnosis not present

## 2019-06-04 DIAGNOSIS — Z1231 Encounter for screening mammogram for malignant neoplasm of breast: Secondary | ICD-10-CM

## 2019-06-05 LAB — CBC WITH DIFFERENTIAL/PLATELET
Basophils Absolute: 0 10*3/uL (ref 0.0–0.2)
Basos: 1 %
EOS (ABSOLUTE): 0.3 10*3/uL (ref 0.0–0.4)
Eos: 4 %
Hematocrit: 37.7 % (ref 34.0–46.6)
Hemoglobin: 11.5 g/dL (ref 11.1–15.9)
Immature Grans (Abs): 0 10*3/uL (ref 0.0–0.1)
Immature Granulocytes: 0 %
Lymphocytes Absolute: 1.7 10*3/uL (ref 0.7–3.1)
Lymphs: 21 %
MCH: 25.6 pg — ABNORMAL LOW (ref 26.6–33.0)
MCHC: 30.5 g/dL — ABNORMAL LOW (ref 31.5–35.7)
MCV: 84 fL (ref 79–97)
Monocytes Absolute: 0.5 10*3/uL (ref 0.1–0.9)
Monocytes: 7 %
Neutrophils Absolute: 5.5 10*3/uL (ref 1.4–7.0)
Neutrophils: 67 %
Platelets: 300 10*3/uL (ref 150–450)
RBC: 4.5 x10E6/uL (ref 3.77–5.28)
RDW: 18.5 % — ABNORMAL HIGH (ref 11.7–15.4)
WBC: 8.1 10*3/uL (ref 3.4–10.8)

## 2019-06-12 ENCOUNTER — Telehealth: Payer: Self-pay

## 2019-06-12 ENCOUNTER — Encounter: Payer: Self-pay | Admitting: Obstetrics and Gynecology

## 2019-06-12 NOTE — Telephone Encounter (Signed)
Routing to [employer] for review and return call to patient before scheduling.

## 2019-06-12 NOTE — Telephone Encounter (Signed)
Pt sent Mychart message:   [place], Robin S  [name], MD 18 minutes ago (1:24 PM)  ML Hello, I was waiting to see when I was scheduled for my hysterectomy and left ovary removal surgery.  Thanks!   Routing to [place], Therapist, sports for surgery planning.

## 2019-06-16 NOTE — Telephone Encounter (Signed)
Spoke with patient. Surgery scheduled for 07/14/2019 0730 [facility]. Pre op scheduled for 06/25/2019 at 9 am. COVID test scheduled for 07/10/2019 at 3:05 pm at the [facility] location. Patient is aware of the need to quarantine after test until surgery. 1 week post op 07/21/2019 at 3 pm with Dr.Silva. 6 week post op 08/25/2019 at 4 pm with Dr.Silva. Surgery instructions reviewed and mailed to patient's verified home address on file. Patient verbalizes understanding of all instructions and is agreeable to all dates and times.

## 2019-06-16 NOTE — Telephone Encounter (Signed)
Spoke with patient. Surgery scheduled for 07/14/2019 0730 [facility]. Pre op scheduled for 06/25/2019 at 9 am. COVID test scheduled for 07/10/2019 at 3:05 pm at the [facility] location. Patient is aware of the need to quarantine after test until surgery. 1 week post op 07/21/2019 at 3 pm with Dr.Silva. 6 week post op 08/25/2019 at 4 pm with Dr.Silva. Surgery instructions reviewed and mailed to patient's verified home address on file. Patient verbalizes understanding of all instructions and is agreeable to all dates and times.

## 2019-06-16 NOTE — Telephone Encounter (Signed)
Spoke with patient regarding surgery benefits. Patient acknowledges understanding of information presented. Patient is aware that benefits presented are for professional benefits only. Patient is aware that once surgery is scheduled, the hospital will call with separate benefits. Patient is aware of surgery cancellation policy. ° °Patient ready to proceed with scheduling. °

## 2019-06-24 NOTE — Progress Notes (Signed)
GYNECOLOGY  VISIT   HPI: 44 y.o.   Married  Caucasian  female   [phone] with Patient's last menstrual period was 06/22/2019 (approximate).   here for surgical consult.    Patient is interested in hysterectomy.   Patient has ongoing vaginal bleeding, thickened endometrium, and two left ovarian cysts - 5 cm and 3 cm with suggestion of septation.  Her right ovary is normal. She has had continual bleeding from 02/19/19 to 06/03/19. She had a course of Provera which did not help the bleeding.  She is having moderate bleeding today. Hgb 10.0 on 05/10/19. Her CA125 is 13.3 and CEA 0.5.  Sonohysterogram showed a filling defect 22 mm.  EMB showed a benign polyp.   She declines future childbearing.  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   GYNECOLOGIC HISTORY: Patient's last menstrual period was 06/22/2019 (approximate). Contraception:  none Menopausal hormone therapy:  Last mammogram: 06-04-19 3D/Neg/density B/BiRads1 Last pap smear: 05-19-19 Neg:Neg HR HPV, 2019 normal per patient        OB History    Gravida  3   Para  1   Term  1   Preterm      AB  2   Living  1     SAB  2   TAB      Ectopic      Multiple      Live Births                 There are no problems to display for this patient.   Past Medical History:  Diagnosis Date  . Abnormal uterine bleeding    endometrial polyp  . Anemia   . Ovarian cyst    left  . Vertigo     Past Surgical History:  Procedure Laterality Date  . WISDOM TOOTH EXTRACTION      Current Outpatient Medications  Medication Sig Dispense Refill  . Ferrous Sulfate (IRON) 325 (65 Fe) MG TABS Take 1 tablet by mouth daily.     No current facility-administered medications for this visit.     ALLERGIES: Patient has no known allergies.  Family History  Problem Relation Age of Onset  . Cancer Mother        ?cervical ca--dec age 82  . Cancer Father        dec with colon ca age 68  . Hypertension Maternal  Grandfather   . Cancer Maternal Grandfather        dec Lung cancer  . Diabetes Maternal Grandmother     Social History   Socioeconomic History  . Marital status: Married    Spouse name: Not on file  . Number of children: Not on file  . Years of education: Not on file  . Highest education level: Not on file  Occupational History  . Not on file  Tobacco Use  . Smoking status: Never Smoker  . Smokeless tobacco: Never Used  Substance and Sexual Activity  . Alcohol use: Never  . Drug use: Never  . Sexual activity: Yes    Birth control/protection: None  Other Topics Concern  . Not on file  Social History Narrative  . Not on file   Social Determinants of Health   Financial Resource Strain:   . Difficulty of Paying Living Expenses:   Food Insecurity:   . Worried About Charity fundraiser in the Last Year:   . Arboriculturist in the Last Year:   Transportation Needs:   .  Lack of Transportation (Medical):   Marland Kitchen Lack of Transportation (Non-Medical):   Physical Activity:   . Days of Exercise per Week:   . Minutes of Exercise per Session:   Stress:   . Feeling of Stress :   Social Connections:   . Frequency of Communication with Friends and Family:   . Frequency of Social Gatherings with Friends and Family:   . Attends Religious Services:   . Active Member of Clubs or Organizations:   . Attends Archivist Meetings:   Marland Kitchen Marital Status:   Intimate Partner Violence:   . Fear of Current or Ex-Partner:   . Emotionally Abused:   Marland Kitchen Physically Abused:   . Sexually Abused:     Review of Systems  All other systems reviewed and are negative.   PHYSICAL EXAMINATION:    BP 130/84 (Cuff Size: Large)   Pulse 88   Temp (!) 96.3 F (35.7 C) (Temporal)   Ht 5\' 9"  (1.753 m)   Wt (!) 312 lb (141.5 kg)   LMP 06/22/2019 (Approximate)   BMI 46.07 kg/m     General appearance: alert, cooperative and appears stated age Head: Normocephalic, without obvious abnormality,  atraumatic Neck: no adenopathy, supple, symmetrical, trachea midline and thyroid normal to inspection and palpation Lungs: clear to auscultation bilaterally Heart: regular rate and rhythm Abdomen: soft, non-tender, no masses,  no organomegaly Extremities: extremities normal, atraumatic, no cyanosis or edema Skin: Skin color, texture, turgor normal. No rashes or lesions Lymph nodes: Cervical, supraclavicular, and axillary nodes normal. No abnormal inguinal nodes palpated Neurologic: Grossly normal  Pelvic: External genitalia:  no lesions              Urethra:  normal appearing urethra with no masses, tenderness or lesions              Bartholins and Skenes: normal                 Vagina: normal appearing vagina with normal color and discharge, no lesions              Cervix: no lesions.  Menstrual flow noted.                  Bimanual Exam:  Uterus:  normal size, contour, position, consistency, mobility, non-tender.  Good mobility.               Adnexa: no mass, fullness, tenderness on right.  Left adnexal fullness present.               Chaperone was present for exam.  ASSESSMENT  Menorrhagia with irregular menses.  Endometrial polyp.  Left ovarian cysts.  Anemia.  PLAN  I discussed total laparoscopic hysterectomy with bilateral salpingectomy and possible bilateral oophorectomy, cystoscopy.  Left ovary will be removed.   I reviewed risks, benefits, and alternatives.  Risks include but are not limited to bleeding, infection, damage to surrounding organs, pneumonia, reaction to anesthesia, DVT, PE, death, need for reoperation, hernia formation, vaginal cuff dehiscence, neuropathy, need to convert to a traditional laparotomy incision to complete the procedure.  Surgical expectations and recovery discussed.  Patient wishes to proceed.  She will receive Lovenox for DVT/PE prophylaxis.  Check CBC, iron and ferritin today.   Questions invited and answered.  An After Visit Summary  was printed and given to the patient.  ___30____ minutes face to face time of which over 50% was spent in counseling.

## 2019-06-25 ENCOUNTER — Other Ambulatory Visit: Payer: Self-pay

## 2019-06-25 ENCOUNTER — Encounter: Payer: Self-pay | Admitting: Obstetrics and Gynecology

## 2019-06-25 ENCOUNTER — Ambulatory Visit (INDEPENDENT_AMBULATORY_CARE_PROVIDER_SITE_OTHER): Payer: BC Managed Care – PPO | Admitting: Obstetrics and Gynecology

## 2019-06-25 VITALS — BP 130/84 | HR 88 | Temp 96.3°F | Ht 69.0 in | Wt 312.0 lb

## 2019-06-25 DIAGNOSIS — D649 Anemia, unspecified: Secondary | ICD-10-CM | POA: Diagnosis not present

## 2019-06-25 DIAGNOSIS — N840 Polyp of corpus uteri: Secondary | ICD-10-CM | POA: Diagnosis not present

## 2019-06-25 DIAGNOSIS — N921 Excessive and frequent menstruation with irregular cycle: Secondary | ICD-10-CM | POA: Diagnosis not present

## 2019-06-25 DIAGNOSIS — N83202 Unspecified ovarian cyst, left side: Secondary | ICD-10-CM | POA: Diagnosis not present

## 2019-06-26 LAB — CBC
Hematocrit: 40.1 % (ref 34.0–46.6)
Hemoglobin: 12.5 g/dL (ref 11.1–15.9)
MCH: 26 pg — ABNORMAL LOW (ref 26.6–33.0)
MCHC: 31.2 g/dL — ABNORMAL LOW (ref 31.5–35.7)
MCV: 84 fL (ref 79–97)
Platelets: 325 10*3/uL (ref 150–450)
RBC: 4.8 x10E6/uL (ref 3.77–5.28)
RDW: 16.9 % — ABNORMAL HIGH (ref 11.7–15.4)
WBC: 8.2 10*3/uL (ref 3.4–10.8)

## 2019-06-26 LAB — FERRITIN: Ferritin: 42 ng/mL (ref 15–150)

## 2019-06-26 LAB — IRON: Iron: 51 ug/dL (ref 27–159)

## 2019-06-30 NOTE — H&P (Signed)
Office Visit  06/25/2019 [place] Silva, Everardo All, MD Obstetrics and Gynecology  Menorrhagia with irregular cycle +3 more Dx  Surgery Consult ; Referred by Beverley Fiedler, FNP Reason for Visit  Additional Documentation  Vitals:    BP 130/84 (Cuff Size: Large)  Pulse 88  Temp 96.3 F (35.7 C)  (Temporal)  Ht 5\' 9"  (1.753 m)  Wt 141.5 kg   LMP 06/22/2019 (Approximate)  BMI 46.07 kg/m  BSA 2.62 m  Flowsheets:    NEWS,  MEWS Score,  Anthropometrics,  Method of Visit    Encounter Info:   Billing Info,  History,  Allergies,  Detailed Report    Orthostatic Vitals Recorded in This Encounter   06/25/2019  0904     Cuff Size: Large  All Notes   Progress Notes by [name], MD at 06/25/2019 9:00 AM Author: [name], MD Author Type: Physician Filed: 06/29/2019  5:36 PM  Note Status: Signed Cosign: Cosign Not Required Encounter Date: 06/25/2019  Editor: [name], MD (Physician)  Prior Versions: 1. Lowella Fairy, CMA (Certified Psychologist, sport and exercise) at 06/25/2019  9:11 AM - Sign when Signing Visit    GYNECOLOGY  VISIT   HPI: 44 y.o.   Married  Caucasian  female   [phone] with Patient's last menstrual period was 06/22/2019 (approximate).   here for surgical consult.     Patient is interested in hysterectomy.    Patient has ongoing vaginal bleeding, thickened endometrium, and two left ovarian cysts - 5 cm and 3 cm with suggestion of septation.  Her right ovary is normal. She has had continual bleeding from 02/19/19 to 06/03/19. She had a course of Provera which did not help the bleeding.  She is having moderate bleeding today. Hgb 10.0 on 05/10/19. Her CA125 is 13.3 and CEA 0.5.   Sonohysterogram showed a filling defect 22 mm.  EMB showed a benign polyp.   She declines future childbearing.  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    GYNECOLOGIC  HISTORY: Patient's last menstrual period was 06/22/2019 (approximate). Contraception:  none Menopausal hormone therapy:  Last mammogram: 06-04-19 3D/Neg/density B/BiRads1 Last pap smear: 05-19-19 Neg:Neg HR HPV, 2019 normal per patient                OB History     Gravida  3   Para  1   Term  1   Preterm      AB  2   Living  1      SAB  2   TAB      Ectopic      Multiple      Live Births                    There are no problems to display for this patient.         Past Medical History:  Diagnosis Date  . Abnormal uterine bleeding      endometrial polyp  . Anemia    . Ovarian cyst      left  . Vertigo             Past Surgical History:  Procedure Laterality Date  . WISDOM TOOTH EXTRACTION                Current Outpatient Medications  Medication Sig Dispense Refill  . Ferrous Sulfate (IRON) 325 (65 Fe)  MG TABS Take 1 tablet by mouth daily.        No current facility-administered medications for this visit.      ALLERGIES: Patient has no known allergies.        Family History  Problem Relation Age of Onset  . Cancer Mother          ?cervical ca--dec age 20  . Cancer Father          dec with colon ca age 93  . Hypertension Maternal Grandfather    . Cancer Maternal Grandfather          dec Lung cancer  . Diabetes Maternal Grandmother        Social History         Socioeconomic History  . Marital status: Married      Spouse name: Not on file  . Number of children: Not on file  . Years of education: Not on file  . Highest education level: Not on file  Occupational History  . Not on file  Tobacco Use  . Smoking status: Never Smoker  . Smokeless tobacco: Never Used  Substance and Sexual Activity  . Alcohol use: Never  . Drug use: Never  . Sexual activity: Yes      Birth control/protection: None  Other Topics Concern  . Not on file  Social History Narrative  . Not on file    Social Determinants of Health       Financial  Resource Strain:   . Difficulty of Paying Living Expenses:   Food Insecurity:   . Worried About Charity fundraiser in the Last Year:   . Arboriculturist in the Last Year:   Transportation Needs:   . Film/video editor (Medical):   Marland Kitchen Lack of Transportation (Non-Medical):   Physical Activity:   . Days of Exercise per Week:   . Minutes of Exercise per Session:   Stress:   . Feeling of Stress :   Social Connections:   . Frequency of Communication with Friends and Family:   . Frequency of Social Gatherings with Friends and Family:   . Attends Religious Services:   . Active Member of Clubs or Organizations:   . Attends Archivist Meetings:   Marland Kitchen Marital Status:   Intimate Partner Violence:   . Fear of Current or Ex-Partner:   . Emotionally Abused:   Marland Kitchen Physically Abused:   . Sexually Abused:       Review of Systems  All other systems reviewed and are negative.     PHYSICAL EXAMINATION:     BP 130/84 (Cuff Size: Large)   Pulse 88   Temp (!) 96.3 F (35.7 C) (Temporal)   Ht 5\' 9"  (1.753 m)   Wt (!) 312 lb (141.5 kg)   LMP 06/22/2019 (Approximate)   BMI 46.07 kg/m     General appearance: alert, cooperative and appears stated age Head: Normocephalic, without obvious abnormality, atraumatic Neck: no adenopathy, supple, symmetrical, trachea midline and thyroid normal to inspection and palpation Lungs: clear to auscultation bilaterally Heart: regular rate and rhythm Abdomen: soft, non-tender, no masses,  no organomegaly Extremities: extremities normal, atraumatic, no cyanosis or edema Skin: Skin color, texture, turgor normal. No rashes or lesions Lymph nodes: Cervical, supraclavicular, and axillary nodes normal. No abnormal inguinal nodes palpated Neurologic: Grossly normal   Pelvic: External genitalia:  no lesions              Urethra:  normal  appearing urethra with no masses, tenderness or lesions              Bartholins and Skenes: normal                  Vagina: normal appearing vagina with normal color and discharge, no lesions              Cervix: no lesions.  Menstrual flow noted.                  Bimanual Exam:  Uterus:  normal size, contour, position, consistency, mobility, non-tender.  Good mobility.               Adnexa: no mass, fullness, tenderness on right.  Left adnexal fullness present.               Chaperone was present for exam.   ASSESSMENT   Menorrhagia with irregular menses.  Endometrial polyp.  Left ovarian cysts.  Anemia.   PLAN   I discussed total laparoscopic hysterectomy with bilateral salpingectomy and possible bilateral oophorectomy, cystoscopy.  Left ovary will be removed.    I reviewed risks, benefits, and alternatives.  Risks include but are not limited to bleeding, infection, damage to surrounding organs, pneumonia, reaction to anesthesia, DVT, PE, death, need for reoperation, hernia formation, vaginal cuff dehiscence, neuropathy, need to convert to a traditional laparotomy incision to complete the procedure.  Surgical expectations and recovery discussed.  Patient wishes to proceed.   She will receive Lovenox for DVT/PE prophylaxis.   Check CBC, iron and ferritin today.    Questions invited and answered.   An After Visit Summary was printed and given to the patient.   ___30____ minutes face to face time of which over 50% was spent in counseling.

## 2019-07-08 NOTE — Patient Instructions (Addendum)
DUE TO COVID-19 ONLY ONE VISITOR IS ALLOWED IN WAITING ROOM (VISITOR WILL HAVE A TEMPERATURE CHECK ON ARRIVAL AND MUST WEAR A FACE MASK THE ENTIRE TIME.)  ONCE YOU ARE ADMITTED TO YOUR PRIVATE ROOM, THE SAME ONE VISITOR IS ALLOWED TO VISIT DURING VISITING HOURS ONLY.  Your COVID swab testing is scheduled for Friday, July 10, 2019 at 3:00 PM , You must self quarantine after your testing per handout given to you at the testing site.  ([facility] testing enter pre-surgical testing line)   Your procedure is scheduled on: Tuesday, July 14, 2019  Report to [facility] AT  5:30 A. M.   Call this number if you have problems the morning of surgery:  [phone].   OUR ADDRESS IS [address].  WE ARE LOCATED IN THE [place]                                   [facility].                                     REMEMBER:  DO NOT EAT FOOD  AFTER MIDNIGHT .    MAY HAVE LIQUIDS UNTIL 4:30 AM DAY OF SURGERY  CLEAR LIQUID DIET  Foods Allowed                                                                     Foods Excluded  Water, Black Coffee and tea, regular and decaf                             liquids that you cannot  Plain Jell-O in any flavor  (No red)                                           see through such as: Fruit ices (not with fruit pulp)                                     milk, soups, orange juice  Iced Popsicles (No red)                                    All solid food Carbonated beverages, regular and diet                                    Apple juices Sports drinks like Gatorade (No red) Lightly seasoned clear broth or consume(fat free) Sugar, honey syrup  Sample Menu Breakfast  Lunch                                     Supper Cranberry juice                    Beef broth                            Chicken broth Jell-O                                     Grape  juice                           Apple juice Coffee or tea                        Jell-O                                      Popsicle                                                Coffee or tea                        Coffee or tea  BRUSH YOUR TEETH THE MORNING OF SURGERY.  TAKE THESE MEDICATIONS MORNING OF SURGERY WITH A SIP OF WATER:  NONE  DO NOT WEAR JEWERLY, MAKE UP, OR NAIL POLISH.  DO NOT WEAR LOTIONS, POWDERS, PERFUMES/COLOGNE OR DEODORANT.  DO NOT SHAVE FOR 24 HOURS PRIOR TO DAY OF SURGERY.  CONTACTS, GLASSES, OR DENTURES MAY NOT BE WORN TO SURGERY.                                    [facility] IS NOT RESPONSIBLE  FOR ANY BELONGINGS.          BRING ALL PRESCRIPTION MEDICATIONS WITH YOU THE DAY OF SURGERY IN ORIGINAL CONTAINERS                                                               [facility] - Preparing for Surgery Before surgery, you can play an important role.  Because skin is not sterile, your skin needs to be as free of germs as possible.  You can reduce the number of germs on your skin by washing with CHG (chlorahexidine gluconate) soap before surgery.  CHG is an antiseptic cleaner which kills germs and bonds with the skin to continue killing germs even after washing. Please DO NOT use if you have an allergy to CHG or antibacterial soaps.  If your skin becomes reddened/irritated stop using the CHG and inform your nurse when you arrive at Short Stay. Do not shave (  including legs and underarms) for at least 48 hours prior to the first CHG shower.  You may shave your face/neck.  Please follow these instructions carefully:  1.  Shower with CHG Soap the night before surgery and the  morning of surgery.  2.  If you choose to wash your hair, wash your hair first as usual with your normal  shampoo.  3.  After you shampoo, rinse your hair and body thoroughly to remove the shampoo.                             4.  Use CHG as you would any other liquid soap.  You can apply chg  directly to the skin and wash.  Gently with a scrungie or clean washcloth.  5.  Apply the CHG Soap to your body ONLY FROM THE NECK DOWN.   Do   not use on face/ open                           Wound or open sores. Avoid contact with eyes, ears mouth and   genitals (private parts).                       Wash face,  Genitals (private parts) with your normal soap.             6.  Wash thoroughly, paying special attention to the area where your    surgery  will be performed.  7.  Thoroughly rinse your body with warm water from the neck down.  8.  DO NOT shower/wash with your normal soap after using and rinsing off the CHG Soap.                9.  Pat yourself dry with a clean towel.            10.  Wear clean pajamas.            11.  Place clean sheets on your bed the night of your first shower and do not  sleep with pets. Day of Surgery : Do not apply any lotions/deodorants the morning of surgery.  Please wear clean clothes to the hospital/surgery center.  FAILURE TO FOLLOW THESE INSTRUCTIONS MAY RESULT IN THE CANCELLATION OF YOUR SURGERY  PATIENT SIGNATURE_________________________________  NURSE SIGNATURE__________________________________  ________________________________________________________________________   Adam Phenix  An incentive spirometer is a tool that can help keep your lungs clear and active. This tool measures how well you are filling your lungs with each breath. Taking long deep breaths may help reverse or decrease the chance of developing breathing (pulmonary) problems (especially infection) following:  A long period of time when you are unable to move or be active. BEFORE THE PROCEDURE   If the spirometer includes an indicator to show your best effort, your nurse or respiratory therapist will set it to a desired goal.  If possible, sit up straight or lean slightly forward. Try not to slouch.  Hold the incentive spirometer in an upright position. INSTRUCTIONS  FOR USE  1. Sit on the edge of your bed if possible, or sit up as far as you can in bed or on a chair. 2. Hold the incentive spirometer in an upright position. 3. Breathe out normally. 4. Place the mouthpiece in your mouth and seal your lips tightly around it. 5. Breathe in slowly and as deeply  as possible, raising the piston or the ball toward the top of the column. 6. Hold your breath for 3-5 seconds or for as long as possible. Allow the piston or ball to fall to the bottom of the column. 7. Remove the mouthpiece from your mouth and breathe out normally. 8. Rest for a few seconds and repeat Steps 1 through 7 at least 10 times every 1-2 hours when you are awake. Take your time and take a few normal breaths between deep breaths. 9. The spirometer may include an indicator to show your best effort. Use the indicator as a goal to work toward during each repetition. 10. After each set of 10 deep breaths, practice coughing to be sure your lungs are clear. If you have an incision (the cut made at the time of surgery), support your incision when coughing by placing a pillow or rolled up towels firmly against it. Once you are able to get out of bed, walk around indoors and cough well. You may stop using the incentive spirometer when instructed by your caregiver.  RISKS AND COMPLICATIONS  Take your time so you do not get dizzy or light-headed.  If you are in pain, you may need to take or ask for pain medication before doing incentive spirometry. It is harder to take a deep breath if you are having pain. AFTER USE  Rest and breathe slowly and easily.  It can be helpful to keep track of a log of your progress. Your caregiver can provide you with a simple table to help with this. If you are using the spirometer at home, follow these instructions: [place] IF:   You are having difficultly using the spirometer.  You have trouble using the spirometer as often as instructed.  Your pain  medication is not giving enough relief while using the spirometer.  You develop fever of 100.5 F (38.1 C) or higher. SEEK IMMEDIATE MEDICAL CARE IF:   You cough up bloody sputum that had not been present before.  You develop fever of 102 F (38.9 C) or greater.  You develop worsening pain at or near the incision site. MAKE SURE YOU:   Understand these instructions.  Will watch your condition.  Will get help right away if you are not doing well or get worse. Document Released: 07/16/2006 Document Revised: 05/28/2011 Document Reviewed: 09/16/2006 Exit[employer] Patient Information 2014 Exit[employer], [place].   ________________________________________________________________________  WHAT IS A BLOOD TRANSFUSION? Blood Transfusion Information  A transfusion is the replacement of blood or some of its parts. Blood is made up of multiple cells which provide different functions.  Red blood cells carry oxygen and are used for blood loss replacement.  White blood cells fight against infection.  Platelets control bleeding.  Plasma helps clot blood.  Other blood products are available for specialized needs, such as hemophilia or other clotting disorders. BEFORE THE TRANSFUSION  Who gives blood for transfusions?   Healthy volunteers who are fully evaluated to make sure their blood is safe. This is blood bank blood. Transfusion therapy is the safest it has ever been in the practice of medicine. Before blood is taken from a donor, a complete history is taken to make sure that person has no history of diseases nor engages in risky social behavior (examples are intravenous drug use or sexual activity with multiple partners). The donor's travel history is screened to minimize risk of transmitting infections, such as malaria. The donated blood is tested for signs of infectious diseases, such  as HIV and hepatitis. The blood is then tested to be sure it is compatible with you in order to minimize the  chance of a transfusion reaction. If you or a relative donates blood, this is often done in anticipation of surgery and is not appropriate for emergency situations. It takes many days to process the donated blood. RISKS AND COMPLICATIONS Although transfusion therapy is very safe and saves many lives, the main dangers of transfusion include:   Getting an infectious disease.  Developing a transfusion reaction. This is an allergic reaction to something in the blood you were given. Every precaution is taken to prevent this. The decision to have a blood transfusion has been considered carefully by your caregiver before blood is given. Blood is not given unless the benefits outweigh the risks. AFTER THE TRANSFUSION  Right after receiving a blood transfusion, you will usually feel much better and more energetic. This is especially true if your red blood cells have gotten low (anemic). The transfusion raises the level of the red blood cells which carry oxygen, and this usually causes an energy increase.  The nurse administering the transfusion will monitor you carefully for complications. HOME CARE INSTRUCTIONS  No special instructions are needed after a transfusion. You may find your energy is better. Speak with your caregiver about any limitations on activity for underlying diseases you may have. SEEK MEDICAL CARE IF:   Your condition is not improving after your transfusion.  You develop redness or irritation at the intravenous (IV) site. SEEK IMMEDIATE MEDICAL CARE IF:  Any of the following symptoms occur over the next 12 hours:  Shaking chills.  You have a temperature by mouth above 102 F (38.9 C), not controlled by medicine.  Chest, back, or muscle pain.  People around you feel you are not acting correctly or are confused.  Shortness of breath or difficulty breathing.  Dizziness and fainting.  You get a rash or develop hives.  You have a decrease in urine output.  Your urine  turns a dark color or changes to pink, red, or brown. Any of the following symptoms occur over the next 10 days:  You have a temperature by mouth above 102 F (38.9 C), not controlled by medicine.  Shortness of breath.  Weakness after normal activity.  The white part of the eye turns yellow (jaundice).  You have a decrease in the amount of urine or are urinating less often.  Your urine turns a dark color or changes to pink, red, or brown. Document Released: 03/02/2000 Document Revised: 05/28/2011 Document Reviewed: 10/20/2007 [facility] Patient Information 2014 [place], [place].  _______________________________________________________________________

## 2019-07-09 ENCOUNTER — Encounter (HOSPITAL_COMMUNITY): Payer: Self-pay

## 2019-07-09 ENCOUNTER — Other Ambulatory Visit: Payer: Self-pay

## 2019-07-09 ENCOUNTER — Encounter (HOSPITAL_COMMUNITY)
Admission: RE | Admit: 2019-07-09 | Discharge: 2019-07-09 | Disposition: A | Discharge status: Home / Self Care | Payer: BC Managed Care – PPO | Source: Ambulatory Visit | Attending: Obstetrics and Gynecology | Admitting: Obstetrics and Gynecology

## 2019-07-09 NOTE — Progress Notes (Signed)
PCP - L. Vanderburg FNP 2/21 Cardiologist - N/A  Chest x-ray - N/A EKG - greater than 1 year Stress Test - N/A ECHO - N/A Cardiac Cath - N/A  Sleep Study - N/A CPAP - N/A  Fasting Blood Sugar - N/A Checks Blood Sugar __N/A___ times a day  Blood Thinner Instructions: N/A Aspirin Instructions:N/A Last Dose:N/A  Anesthesia review: N/A  Patient denies shortness of breath, fever, cough and chest pain at PAT appointment   Patient verbalized understanding of instructions that were given to them at the PAT appointment. Patient was also instructed that they will need to review over the PAT instructions again at home before surgery.

## 2019-07-10 ENCOUNTER — Encounter (HOSPITAL_COMMUNITY)
Admission: RE | Admit: 2019-07-10 | Discharge: 2019-07-10 | Disposition: A | Discharge status: Home / Self Care | Payer: BC Managed Care – PPO | Source: Ambulatory Visit | Attending: Obstetrics and Gynecology | Admitting: Obstetrics and Gynecology

## 2019-07-10 ENCOUNTER — Other Ambulatory Visit (HOSPITAL_COMMUNITY)
Admission: RE | Admit: 2019-07-10 | Discharge: 2019-07-10 | Disposition: A | Discharge status: Home / Self Care | Payer: BC Managed Care – PPO | Source: Ambulatory Visit | Attending: Obstetrics and Gynecology | Admitting: Obstetrics and Gynecology

## 2019-07-10 DIAGNOSIS — Z01812 Encounter for preprocedural laboratory examination: Secondary | ICD-10-CM | POA: Insufficient documentation

## 2019-07-10 DIAGNOSIS — Z20822 Contact with and (suspected) exposure to covid-19: Secondary | ICD-10-CM | POA: Diagnosis not present

## 2019-07-10 LAB — CBC
HCT: 38.8 % (ref 36.0–46.0)
Hemoglobin: 12.3 g/dL (ref 12.0–15.0)
MCH: 26.6 pg (ref 26.0–34.0)
MCHC: 31.7 g/dL (ref 30.0–36.0)
MCV: 83.8 fL (ref 80.0–100.0)
Platelets: 277 10*3/uL (ref 150–400)
RBC: 4.63 MIL/uL (ref 3.87–5.11)
RDW: 15.7 % — ABNORMAL HIGH (ref 11.5–15.5)
WBC: 7.7 10*3/uL (ref 4.0–10.5)
nRBC: 0 % (ref 0.0–0.2)

## 2019-07-10 LAB — HEMOGLOBIN A1C
Hgb A1c MFr Bld: 5.1 % (ref 4.8–5.6)
Mean Plasma Glucose: 99.67 mg/dL

## 2019-07-10 LAB — BASIC METABOLIC PANEL
Anion gap: 10 (ref 5–15)
BUN: 13 mg/dL (ref 6–20)
CO2: 24 mmol/L (ref 22–32)
Calcium: 9.2 mg/dL (ref 8.9–10.3)
Chloride: 107 mmol/L (ref 98–111)
Creatinine, Ser: 0.73 mg/dL (ref 0.44–1.00)
GFR calc Af Amer: >60 mL/min (ref >60)
GFR calc non Af Amer: >60 mL/min (ref >60)
Glucose, Bld: 110 mg/dL — ABNORMAL HIGH (ref 70–99)
Potassium: 3.8 mmol/L (ref 3.5–5.1)
Sodium: 141 mmol/L (ref 135–145)

## 2019-07-10 LAB — ABO/RH: ABO/RH(D): B POS

## 2019-07-10 LAB — SARS CORONAVIRUS 2 (TAT 6-24 HRS): SARS Coronavirus 2: NEGATIVE

## 2019-07-10 NOTE — Progress Notes (Signed)
Received call from Dr Elza Rafter nurse, Sharee Pimple, asked for a A1c to be added to pt's lab work done this morning.  Entered verbal order and released.  Called lab to add A1c.

## 2019-07-13 NOTE — Anesthesia Preprocedure Evaluation (Addendum)
Anesthesia Evaluation  Patient identified by MRN, date of birth, ID band Patient awake    Reviewed: Allergy & Precautions, NPO status , Patient's Chart, lab work & pertinent test results  Airway Mallampati: III  TM Distance: <3 FB Neck ROM: Full    Dental no notable dental hx.    Pulmonary neg pulmonary ROS,    Pulmonary exam normal breath sounds clear to auscultation       Cardiovascular negative cardio ROS Normal cardiovascular exam Rhythm:Regular Rate:Normal     Neuro/Psych negative neurological ROS  negative psych ROS   GI/Hepatic negative GI ROS, Neg liver ROS,   Endo/Other  Morbid obesity  Renal/GU negative Renal ROS  negative genitourinary   Musculoskeletal negative musculoskeletal ROS (+)   Abdominal (+) + obese,   Peds negative pediatric ROS (+)  Hematology negative hematology ROS (+)   Anesthesia Other Findings   Reproductive/Obstetrics negative OB ROS                           Anesthesia Physical Anesthesia Plan  ASA: II  Anesthesia Plan: General   Post-op Pain Management:    Induction: Intravenous  PONV Risk Score and Plan: 4 or greater and Ondansetron, Dexamethasone, Midazolam, Treatment may vary due to age or medical condition and Scopolamine patch - Pre-op  Airway Management Planned: Oral ETT  Additional Equipment:   Intra-op Plan:   Post-operative Plan: Extubation in OR  Informed Consent: I have reviewed the patients History and Physical, chart, labs and discussed the procedure including the risks, benefits and alternatives for the proposed anesthesia with the patient or authorized representative who has indicated his/her understanding and acceptance.     Dental advisory given  Plan Discussed with: CRNA and Surgeon  Anesthesia Plan Comments:         Anesthesia Quick Evaluation

## 2019-07-14 ENCOUNTER — Observation Stay (HOSPITAL_BASED_OUTPATIENT_CLINIC_OR_DEPARTMENT_OTHER)
Admission: RE | Admit: 2019-07-14 | Discharge: 2019-07-14 | Disposition: A | Discharge status: Home / Self Care | Payer: BC Managed Care – PPO | Attending: Obstetrics and Gynecology | Admitting: Obstetrics and Gynecology

## 2019-07-14 ENCOUNTER — Observation Stay (HOSPITAL_BASED_OUTPATIENT_CLINIC_OR_DEPARTMENT_OTHER): Payer: BC Managed Care – PPO | Admitting: Anesthesiology

## 2019-07-14 ENCOUNTER — Encounter (HOSPITAL_BASED_OUTPATIENT_CLINIC_OR_DEPARTMENT_OTHER): Payer: Self-pay | Admitting: Obstetrics and Gynecology

## 2019-07-14 ENCOUNTER — Encounter (HOSPITAL_BASED_OUTPATIENT_CLINIC_OR_DEPARTMENT_OTHER)
Admission: RE | Disposition: A | Discharge status: Home / Self Care | Payer: Self-pay | Source: Home / Self Care | Attending: Obstetrics and Gynecology

## 2019-07-14 DIAGNOSIS — N840 Polyp of corpus uteri: Secondary | ICD-10-CM | POA: Insufficient documentation

## 2019-07-14 DIAGNOSIS — Z6841 Body Mass Index (BMI) 40.0 and over, adult: Secondary | ICD-10-CM | POA: Insufficient documentation

## 2019-07-14 DIAGNOSIS — N83202 Unspecified ovarian cyst, left side: Secondary | ICD-10-CM | POA: Insufficient documentation

## 2019-07-14 DIAGNOSIS — Z800 Family history of malignant neoplasm of digestive organs: Secondary | ICD-10-CM | POA: Insufficient documentation

## 2019-07-14 DIAGNOSIS — Z8049 Family history of malignant neoplasm of other genital organs: Secondary | ICD-10-CM | POA: Insufficient documentation

## 2019-07-14 DIAGNOSIS — N920 Excessive and frequent menstruation with regular cycle: Principal | ICD-10-CM | POA: Insufficient documentation

## 2019-07-14 DIAGNOSIS — N921 Excessive and frequent menstruation with irregular cycle: Secondary | ICD-10-CM | POA: Diagnosis not present

## 2019-07-14 DIAGNOSIS — R9389 Abnormal findings on diagnostic imaging of other specified body structures: Secondary | ICD-10-CM | POA: Diagnosis not present

## 2019-07-14 DIAGNOSIS — N72 Inflammatory disease of cervix uteri: Secondary | ICD-10-CM | POA: Diagnosis not present

## 2019-07-14 DIAGNOSIS — Z90710 Acquired absence of both cervix and uterus: Secondary | ICD-10-CM

## 2019-07-14 HISTORY — DX: Acquired absence of both cervix and uterus: Z90.710

## 2019-07-14 HISTORY — PX: CYSTOSCOPY: SHX5120

## 2019-07-14 HISTORY — PX: TOTAL LAPAROSCOPIC HYSTERECTOMY WITH SALPINGECTOMY: SHX6742

## 2019-07-14 LAB — CBC
HCT: 38.8 % (ref 36.0–46.0)
Hemoglobin: 12.2 g/dL (ref 12.0–15.0)
MCH: 26.9 pg (ref 26.0–34.0)
MCHC: 31.4 g/dL (ref 30.0–36.0)
MCV: 85.5 fL (ref 80.0–100.0)
Platelets: 254 10*3/uL (ref 150–400)
RBC: 4.54 MIL/uL (ref 3.87–5.11)
RDW: 15.7 % — ABNORMAL HIGH (ref 11.5–15.5)
WBC: 15.3 10*3/uL — ABNORMAL HIGH (ref 4.0–10.5)
nRBC: 0 % (ref 0.0–0.2)

## 2019-07-14 LAB — TYPE AND SCREEN
ABO/RH(D): B POS
Antibody Screen: NEGATIVE

## 2019-07-14 LAB — POCT PREGNANCY, URINE: Preg Test, Ur: NEGATIVE

## 2019-07-14 SURGERY — HYSTERECTOMY, TOTAL, LAPAROSCOPIC, WITH SALPINGECTOMY
Anesthesia: General | Site: Bladder

## 2019-07-14 MED ORDER — HYDROMORPHONE HCL 1 MG/ML IJ SOLN
INTRAMUSCULAR | Status: AC
  Filled 2019-07-14: qty 1

## 2019-07-14 MED ORDER — OXYCODONE-ACETAMINOPHEN 5-325 MG PO TABS
ORAL_TABLET | ORAL | Status: AC
  Filled 2019-07-14: qty 1

## 2019-07-14 MED ORDER — ENOXAPARIN SODIUM 40 MG/0.4ML ~~LOC~~ SOLN
40.0000 mg | SUBCUTANEOUS | Status: AC
  Administered 2019-07-14: 06:00:00 40 mg via SUBCUTANEOUS

## 2019-07-14 MED ORDER — BUPIVACAINE HCL (PF) 0.25 % IJ SOLN
INTRAMUSCULAR | Status: DC | PRN
  Administered 2019-07-14: 30 mL

## 2019-07-14 MED ORDER — SUCCINYLCHOLINE CHLORIDE 200 MG/10ML IV SOSY
PREFILLED_SYRINGE | INTRAVENOUS | Status: AC
  Filled 2019-07-14: qty 10

## 2019-07-14 MED ORDER — KETAMINE HCL 10 MG/ML IJ SOLN
INTRAMUSCULAR | Status: AC
  Filled 2019-07-14: qty 1

## 2019-07-14 MED ORDER — PROPOFOL 10 MG/ML IV BOLUS
INTRAVENOUS | Status: DC | PRN
  Administered 2019-07-14: 20 mg via INTRAVENOUS
  Administered 2019-07-14: 200 mg via INTRAVENOUS

## 2019-07-14 MED ORDER — AMISULPRIDE (ANTIEMETIC) 5 MG/2ML IV SOLN: 10.0000 mg | Freq: Once | INTRAVENOUS | Status: DC | PRN

## 2019-07-14 MED ORDER — FENTANYL CITRATE (PF) 100 MCG/2ML IJ SOLN
INTRAMUSCULAR | Status: AC
  Filled 2019-07-14: qty 2

## 2019-07-14 MED ORDER — KETOROLAC TROMETHAMINE 30 MG/ML IJ SOLN: 30.0000 mg | Freq: Once | INTRAMUSCULAR | Status: DC | PRN

## 2019-07-14 MED ORDER — MIDAZOLAM HCL 2 MG/2ML IJ SOLN
INTRAMUSCULAR | Status: DC | PRN
  Administered 2019-07-14: 2 mg via INTRAVENOUS

## 2019-07-14 MED ORDER — LACTATED RINGERS IV SOLN: INTRAVENOUS | Status: DC

## 2019-07-14 MED ORDER — FENTANYL CITRATE (PF) 100 MCG/2ML IJ SOLN
INTRAMUSCULAR | Status: DC | PRN
  Administered 2019-07-14: 50 ug via INTRAVENOUS
  Administered 2019-07-14: 100 ug via INTRAVENOUS
  Administered 2019-07-14: 50 ug via INTRAVENOUS
  Administered 2019-07-14: 5 ug via INTRAVENOUS
  Administered 2019-07-14: 50 ug via INTRAVENOUS

## 2019-07-14 MED ORDER — ONDANSETRON HCL 4 MG/2ML IJ SOLN
INTRAMUSCULAR | Status: DC | PRN
  Administered 2019-07-14 (×2): 4 mg via INTRAVENOUS

## 2019-07-14 MED ORDER — IBUPROFEN 800 MG PO TABS: 800.0000 mg | ORAL_TABLET | Freq: Three times a day (TID) | ORAL | Status: DC | PRN

## 2019-07-14 MED ORDER — KETOROLAC TROMETHAMINE 30 MG/ML IJ SOLN
30.0000 mg | Freq: Four times a day (QID) | INTRAMUSCULAR | Status: DC | PRN
  Administered 2019-07-14: 30 mg via INTRAVENOUS

## 2019-07-14 MED ORDER — MORPHINE SULFATE (PF) 4 MG/ML IV SOLN: 1.0000 mg | INTRAVENOUS | Status: DC | PRN

## 2019-07-14 MED ORDER — ACETAMINOPHEN 10 MG/ML IV SOLN
INTRAVENOUS | Status: DC | PRN
Start: 2019-07-14 — End: 2019-07-14
  Administered 2019-07-14: 1000 mg via INTRAVENOUS

## 2019-07-14 MED ORDER — SUGAMMADEX SODIUM 200 MG/2ML IV SOLN
INTRAVENOUS | Status: DC | PRN
  Administered 2019-07-14: 200 mg via INTRAVENOUS

## 2019-07-14 MED ORDER — KETOROLAC TROMETHAMINE 30 MG/ML IJ SOLN
INTRAMUSCULAR | Status: DC | PRN
  Administered 2019-07-14: 30 mg via INTRAVENOUS

## 2019-07-14 MED ORDER — FENTANYL CITRATE (PF) 250 MCG/5ML IJ SOLN
INTRAMUSCULAR | Status: AC
  Filled 2019-07-14: qty 5

## 2019-07-14 MED ORDER — ROPIVACAINE HCL 5 MG/ML IJ SOLN
INTRAMUSCULAR | Status: AC
  Filled 2019-07-14: qty 30

## 2019-07-14 MED ORDER — MENTHOL 3 MG MT LOZG: 1.0000 | LOZENGE | OROMUCOSAL | Status: DC | PRN

## 2019-07-14 MED ORDER — SODIUM CHLORIDE 0.9 % IR SOLN
Status: DC | PRN
  Administered 2019-07-14: 1000 mL

## 2019-07-14 MED ORDER — LIDOCAINE HCL 2 % IJ SOLN
INTRAMUSCULAR | Status: AC
  Filled 2019-07-14: qty 20

## 2019-07-14 MED ORDER — SILVER NITRATE-POT NITRATE 75-25 % EX MISC
CUTANEOUS | Status: AC
  Filled 2019-07-14: qty 10

## 2019-07-14 MED ORDER — SODIUM CHLORIDE 0.9 % IV SOLN
INTRAVENOUS | Status: AC
  Filled 2019-07-14: qty 2

## 2019-07-14 MED ORDER — DEXAMETHASONE SODIUM PHOSPHATE 10 MG/ML IJ SOLN
INTRAMUSCULAR | Status: DC | PRN
  Administered 2019-07-14: 10 mg via INTRAVENOUS

## 2019-07-14 MED ORDER — ROCURONIUM BROMIDE 10 MG/ML (PF) SYRINGE
PREFILLED_SYRINGE | INTRAVENOUS | Status: DC | PRN
  Administered 2019-07-14: 40 mg via INTRAVENOUS
  Administered 2019-07-14: 10 mg via INTRAVENOUS
  Administered 2019-07-14: 20 mg via INTRAVENOUS
  Administered 2019-07-14: 10 mg via INTRAVENOUS

## 2019-07-14 MED ORDER — SODIUM CHLORIDE (PF) 0.9 % IJ SOLN
INTRAMUSCULAR | Status: AC
  Filled 2019-07-14: qty 50

## 2019-07-14 MED ORDER — KETOROLAC TROMETHAMINE 30 MG/ML IJ SOLN
INTRAMUSCULAR | Status: AC
  Filled 2019-07-14: qty 1

## 2019-07-14 MED ORDER — KETAMINE HCL 10 MG/ML IJ SOLN
INTRAMUSCULAR | Status: DC | PRN
  Administered 2019-07-14: 35 mg via INTRAVENOUS

## 2019-07-14 MED ORDER — SCOPOLAMINE 1 MG/3DAYS TD PT72
MEDICATED_PATCH | TRANSDERMAL | Status: AC
  Filled 2019-07-14: qty 1

## 2019-07-14 MED ORDER — ACETAMINOPHEN 10 MG/ML IV SOLN
INTRAVENOUS | Status: AC
  Filled 2019-07-14: qty 100

## 2019-07-14 MED ORDER — IBUPROFEN 800 MG PO TABS: 800.0000 mg | ORAL_TABLET | Freq: Three times a day (TID) | ORAL | 0 refills | Status: DC | PRN

## 2019-07-14 MED ORDER — ONDANSETRON HCL 4 MG PO TABS: 4.0000 mg | ORAL_TABLET | Freq: Four times a day (QID) | ORAL | Status: DC | PRN

## 2019-07-14 MED ORDER — SUCCINYLCHOLINE CHLORIDE 200 MG/10ML IV SOSY
PREFILLED_SYRINGE | INTRAVENOUS | Status: DC | PRN
  Administered 2019-07-14: 160 mg via INTRAVENOUS

## 2019-07-14 MED ORDER — LIDOCAINE 2% (20 MG/ML) 5 ML SYRINGE
INTRAMUSCULAR | Status: DC | PRN
  Administered 2019-07-14: 1.5 mg/kg/h via INTRAVENOUS

## 2019-07-14 MED ORDER — PROPOFOL 10 MG/ML IV BOLUS
INTRAVENOUS | Status: AC
  Filled 2019-07-14: qty 40

## 2019-07-14 MED ORDER — OXYCODONE-ACETAMINOPHEN 5-325 MG PO TABS: 1.0000 | ORAL_TABLET | ORAL | 0 refills | Status: DC | PRN

## 2019-07-14 MED ORDER — SODIUM CHLORIDE 0.9 % IV SOLN
2.0000 g | INTRAVENOUS | Status: AC
  Administered 2019-07-14: 07:00:00 2 g via INTRAVENOUS

## 2019-07-14 MED ORDER — ONDANSETRON HCL 4 MG/2ML IJ SOLN: 4.0000 mg | Freq: Four times a day (QID) | INTRAMUSCULAR | Status: DC | PRN

## 2019-07-14 MED ORDER — LIDOCAINE 2% (20 MG/ML) 5 ML SYRINGE
INTRAMUSCULAR | Status: DC | PRN
  Administered 2019-07-14: 100 mg via INTRAVENOUS

## 2019-07-14 MED ORDER — ENOXAPARIN SODIUM 40 MG/0.4ML ~~LOC~~ SOLN
SUBCUTANEOUS | Status: AC
  Filled 2019-07-14: qty 0.4

## 2019-07-14 MED ORDER — HYDROMORPHONE HCL 1 MG/ML IJ SOLN
0.2500 mg | INTRAMUSCULAR | Status: DC | PRN
  Administered 2019-07-14: 0.25 mg via INTRAVENOUS

## 2019-07-14 MED ORDER — MIDAZOLAM HCL 2 MG/2ML IJ SOLN
INTRAMUSCULAR | Status: AC
  Filled 2019-07-14: qty 2

## 2019-07-14 MED ORDER — SODIUM CHLORIDE 0.9 % IR SOLN
Status: DC | PRN
  Administered 2019-07-14: 3000 mL

## 2019-07-14 MED ORDER — BUPIVACAINE HCL (PF) 0.25 % IJ SOLN
INTRAMUSCULAR | Status: AC
  Filled 2019-07-14: qty 30

## 2019-07-14 MED ORDER — OXYCODONE-ACETAMINOPHEN 5-325 MG PO TABS
1.0000 | ORAL_TABLET | ORAL | Status: DC | PRN
  Administered 2019-07-14 (×2): 1 via ORAL

## 2019-07-14 SURGICAL SUPPLY — 64 items
ADH SKN CLS APL DERMABOND .7 (GAUZE/BANDAGES/DRESSINGS) ×2
APL SRG 38 LTWT LNG FL B (MISCELLANEOUS) ×2
APPLICATOR ARISTA FLEXITIP XL (MISCELLANEOUS) ×2 IMPLANT
BARRIER ADHS 3X4 INTERCEED (GAUZE/BANDAGES/DRESSINGS) IMPLANT
BRR ADH 4X3 ABS CNTRL BYND (GAUZE/BANDAGES/DRESSINGS)
CABLE HIGH FREQUENCY MONO STRZ (ELECTRODE) IMPLANT
CANISTER SUCT 3000ML PPV (MISCELLANEOUS) ×4 IMPLANT
CELL SAVER LIPIGURD (MISCELLANEOUS) IMPLANT
COVER BACK TABLE 60X90IN (DRAPES) IMPLANT
COVER MAYO STAND STRL (DRAPES) ×4 IMPLANT
COVER WAND RF STERILE (DRAPES) ×4 IMPLANT
DECANTER SPIKE VIAL GLASS SM (MISCELLANEOUS) ×4 IMPLANT
DERMABOND ADVANCED (GAUZE/BANDAGES/DRESSINGS) ×2
DERMABOND ADVANCED .7 DNX12 (GAUZE/BANDAGES/DRESSINGS) ×2 IMPLANT
DEVICE RETRIEVAL ALEXIS 14 (MISCELLANEOUS) IMPLANT
DURAPREP 26ML APPLICATOR (WOUND CARE) ×4 IMPLANT
EXTRT SYSTEM ALEXIS 14CM (MISCELLANEOUS)
EXTRT SYSTEM ALEXIS 17CM (MISCELLANEOUS)
GAUZE 4X4 16PLY RFD (DISPOSABLE) ×4 IMPLANT
GLOVE BIO SURGEON STRL SZ 6.5 (GLOVE) ×6 IMPLANT
GLOVE BIO SURGEONS STRL SZ 6.5 (GLOVE) ×2
GLOVE BIOGEL PI IND STRL 7.0 (GLOVE) ×6 IMPLANT
GLOVE BIOGEL PI INDICATOR 7.0 (GLOVE) ×6
GOWN STRL REUS W/TWL LRG LVL3 (GOWN DISPOSABLE) ×16 IMPLANT
HEMOSTAT ARISTA ABSORB 3G PWDR (HEMOSTASIS) ×2 IMPLANT
HOLDER FOLEY CATH W/STRAP (MISCELLANEOUS) IMPLANT
LIGASURE VESSEL 5MM BLUNT TIP (ELECTROSURGICAL) ×4 IMPLANT
NEEDLE INSUFFLATION 120MM (ENDOMECHANICALS) ×4 IMPLANT
OCCLUDER COLPOPNEUMO (BALLOONS) ×4 IMPLANT
PACK LAPAROSCOPY BASIN (CUSTOM PROCEDURE TRAY) ×4 IMPLANT
PACK TRENDGUARD 450 HYBRID PRO (MISCELLANEOUS) IMPLANT
POUCH LAPAROSCOPIC INSTRUMENT (MISCELLANEOUS) ×4 IMPLANT
PROTECTOR NERVE ULNAR (MISCELLANEOUS) ×8 IMPLANT
RETRACTOR WOUND ALXS 19CM XSML (INSTRUMENTS) IMPLANT
RTRCTR WOUND ALEXIS 19CM XSML (INSTRUMENTS)
SCISSORS LAP 5X35 DISP (ENDOMECHANICALS) IMPLANT
SET IRRIG TUBING LAPAROSCOPIC (IRRIGATION / IRRIGATOR) ×4 IMPLANT
SET IRRIG Y TYPE TUR BLADDER L (SET/KITS/TRAYS/PACK) ×4 IMPLANT
SET TRI-LUMEN FLTR TB AIRSEAL (TUBING) ×4 IMPLANT
SHEARS HARMONIC ACE PLUS 36CM (ENDOMECHANICALS) ×4 IMPLANT
SLEEVE ADV FIXATION 5X100MM (TROCAR) ×4 IMPLANT
SUT VIC AB 0 CT1 27 (SUTURE) ×8
SUT VIC AB 0 CT1 27XBRD ANBCTR (SUTURE) ×4 IMPLANT
SUT VICRYL 0 UR6 27IN ABS (SUTURE) ×2 IMPLANT
SUT VICRYL 4-0 PS2 18IN ABS (SUTURE) ×4 IMPLANT
SUT VLOC 180 0 9IN  GS21 (SUTURE) ×4
SUT VLOC 180 0 9IN GS21 (SUTURE) IMPLANT
SYR 10ML LL (SYRINGE) ×4 IMPLANT
SYR 50ML LL SCALE MARK (SYRINGE) ×8 IMPLANT
SYSTEM CARTER THOMASON II (TROCAR) ×2 IMPLANT
SYSTEM CONTND EXTRCTN KII BLLN (MISCELLANEOUS) IMPLANT
TIP RUMI ORANGE 6.7MMX12CM (TIP) IMPLANT
TIP UTERINE 5.1X6CM LAV DISP (MISCELLANEOUS) IMPLANT
TIP UTERINE 6.7X10CM GRN DISP (MISCELLANEOUS) ×2 IMPLANT
TIP UTERINE 6.7X6CM WHT DISP (MISCELLANEOUS) IMPLANT
TIP UTERINE 6.7X8CM BLUE DISP (MISCELLANEOUS) ×2 IMPLANT
TOWEL OR 17X26 10 PK STRL BLUE (TOWEL DISPOSABLE) ×8 IMPLANT
TRAY FOLEY W/BAG SLVR 14FR (SET/KITS/TRAYS/PACK) ×6 IMPLANT
TRENDGUARD 450 HYBRID PRO PACK (MISCELLANEOUS) ×4
TROCAR 5M 150ML BLDLS (TROCAR) ×2 IMPLANT
TROCAR ADV FIXATION 5X100MM (TROCAR) ×4 IMPLANT
TROCAR BLADELESS OPT 5 100 (ENDOMECHANICALS) ×6 IMPLANT
TROCAR PORT AIRSEAL 8X120 (TROCAR) ×4 IMPLANT
WARMER LAPAROSCOPE (MISCELLANEOUS) ×4 IMPLANT

## 2019-07-14 NOTE — Op Note (Signed)
OPERATIVE REPORT   PREOPERATIVE DIAGNOSIS:   Menorrhagia with irregular menses, endometrial polyp, left ovarian cysts.   POSTOPERATIVE DIAGNOSIS:   Menorrhagia with irregular menses, endometrial polyp.   PROCEDURES:  Total laparoscopic hysterectomy with bilateral salpingectomy, cystoscopy, vaginal removal of endometrial polyp, suture of small vaginal laceration.    SURGEON:  Lenard Galloway, M.D.   ASSISTANT:   Sumner Boast, M.D.   ANESTHESIA:  General endotracheal, local with 0.25% Marcaine.   IVF:  1300 cc LR.   ESTIMATED BLOOD LOSS:  50 cc.   URINE OUTPUT:  100  cc.   COMPLICATIONS:  Small left vaginal laceration.    INDICATIONS FOR THE PROCEDURE:      The patient is a 44 year old Caucasian female who presents with heavy and prolonged vaginal bleeding and anemia.  Pelvic ultrasounds have identified 2 left ovarian cysts, one 5 cm and one 3 cm and thickened endometrium.  Endometrial biopsy confirms a benign polyp. A CA125 measured 13.3.  She declines future childbearing and requests hysterectomy for definitive treatment of her bleeding.  A plan is made to proceed with a total laparoscopic hysterectomy with bilateral salpingectomy, left oophorectomy, and cystoscopy after risks, benefits, and alternatives are reviewed.   FINDINGS:      Exam under anesthesia reveals a small and mobile uterus.  No adnexal masses are noted.   With placement of the RUMI device, a large endometrial polyp was expelled from the cervix.  It measured approximately 2 x 1 cm.   Laparoscopy revealed a globally enlarged uterus and normal bilateral tubes and ovaries. The upper abdomen was normal and demonstrated a normal liver.  The appendix was normal.  There was no adhesive disease or endometriosis were seen in the abdomen or pelvis.     Cystoscopy at the termination of the procedure showed the bladder to be normal throughout 360 degrees including the bladder dome and trigone. There was no evidence of any foreign  body in the bladder or the urethra. There was no evidence of any lesions  of the bladder or the urethra.  Both of the ureters were noted to be patent bilaterally.   SPECIMENS:      The uterus, cervix, and bilateral tubes were sent to pathology separately from the endometrial polyp.   DESCRIPTION OF PROCEDURE:     The patient was reidentified in the preoperative hold area.  She did receive Cefotetan IV for antibiotic prophylaxis.  She received Lovenox, TED hose, and PAS stockings for DVT prophylaxis.   In the operating room, the patient was placed in the dorsal lithotomy position on the operating room table.  Her legs were placed in the [place] stirrups and her arms were both tucked at her sides. The Trendguard was used to support her shoulders.  The patient received general endotracheal anesthesia. The abdomen and vagina were then sterilely prepped and she was sterilely draped.   A speculum was placed in the vagina and a single-tooth tenaculum was placed on the anterior cervical lip.  A figure-of-eight suture of 0 Vicryl was placed on each the anterior and the posterior cervical lips. The uterus was sounded to 8 cm.  The cervix was then dilated with [facility] dilators.  A #8 RUMI tip was placed initially, but this did not provide good application to the cervix.  This was converted to a #10 RUMI tip with a KOH ring.  A large endometrial polyp appeared in the vagina at this time.  It was sent to pathology.  The  remaining vaginal instruments were then removed.  A Foley catheter was placed inside the bladder.   Attention was turned to the abdomen where the umbilical region was injected with 0.25% Marcaine and a small incision created.  A Veress needle was then used to insufflate the abdomen with CO2 gas after a saline drop test was performed and the fluid flowed freely.  A 5 mm umbilical incision was created with a scalpel after the skin.  An attempt was made to place a  5 mm camera port using the Optiview.   Ultimately, the umbilical trocar was placed directly.  5 mm incisions were then created in the left mid abdomen and the left lower abdomen after the skin was injected locally with 0.25% Marcaine.  The 5 mm trocars were then placed under visualization of the laparoscope.  An 8 mm AirSeal trocar was placed in the right lower quadrant after injecting with Marcaine and incising with a scalpel.    The patient was placed in Trendelenburg position.  An inspection of the abdomen and pelvis was performed. The findings are as noted above.    The bilateral ureters were identified.  The right fallopian tube was grasped and the Ligasure was used to cauterize and cut through the mesosalpinx to the level of the uterus.  The right utero-ovarian ligament was similarly cauterized and cut with the Ligasure.  The right round ligament was then cauterized and divided with same instrument.  Dissection was performed to the anterior and posterior leaves of the broad ligaments using the Harmonic scalpel.  The incision was carried across the anterior cul- de-sac along the vesicouterine fold and the bladder was dissected away from the cervix. The peritoneum was taken down posteriorly.  The right uterine artery was skeletonized with the Harmonic scalpel.  It was cauterized and later cut with the Ligasure instrument after the same procedure was performed on the left side.    Attention was turned to the patient's left-hand side at this time.  The same procedure that was performed on the right side was repeated on the left side with respect to the isolation, cautery, and transection of the vessels and the bladder flap dissection.       The KOH ring was nicely visible.  The colpotomy incision was performed with the Harmonic scalpel in a circumferential fashion. The specimen was then removed from the peritoneal cavity and was sent to Pathology.  The balloon occluder was placed in the vagina.  The vaginal cuff was sutured at this time using  two running sutures of 0 V-Loc.  Each suture was started at their respective apices of the vagina and overlapped.  This provided good full-thickness closure of the vaginal cuff.  The laparoscopic needles for suturing was removed from the peritoneal cavity.   The pelvis was irrigated and suctioned.   The pneumoperitoneal was let down.  There was good hemostasis of the operative sites and pedicles.   The 8 mm trocar was removed and the [employer] instrument was used to close the fascia with a 0/0 vicryl suture.    The CO2 pneumoperitoneum was released.  The patient received manual breaths to remove any remaining CO2 gas and all trocars were removed.  The right and left trocar sites were closed with subcuticular sutures of 4-0 Vicryl.  Dermabond was placed over all of the incisions.   The patient's Foley catheter was removed and cystoscopy was performed and the findings are as noted above.  The Foley catheter was left  out.    Final inspection of the vagina demonstrated good hemostasis of the vaginal cuff.  The left vaginal side wall had a small 1 cm laceration, which was closed with a simple 2/0 Vicryl suture.  Hemostasis was good.    This concluded the patient's procedure.  She was extubated and escorted to the recovery room in stable and awake condition.  All needle, instrument, and sponge counts were correct.   Lenard Galloway, M.D.

## 2019-07-14 NOTE — Anesthesia Procedure Notes (Signed)
Procedure Name: Intubation Date/Time: 07/14/2019 7:35 AM Performed by: Mechele Claude, CRNA Pre-anesthesia Checklist: Patient identified, Emergency Drugs available, Suction available and Patient being monitored Patient Re-evaluated:Patient Re-evaluated prior to induction Oxygen Delivery Method: Circle system utilized Preoxygenation: Pre-oxygenation with 100% oxygen Induction Type: IV induction Ventilation: Mask ventilation without difficulty Laryngoscope Size: Mac and 3 Grade View: Grade I Tube type: Oral Tube size: 7.0 mm Number of attempts: 1 Airway Equipment and Method: Stylet and Oral airway Placement Confirmation: ETT inserted through vocal cords under direct vision,  positive ETCO2 and breath sounds checked- equal and bilateral Secured at: 21 (at lip) cm Tube secured with: Tape Dental Injury: Teeth and Oropharynx as per pre-operative assessment

## 2019-07-14 NOTE — Progress Notes (Signed)
Day of Surgery Procedure(s) (LRB): TOTAL LAPAROSCOPIC HYSTERECTOMY WITH BILATERAL SALPINGECTOMY W/ VAGINAL REMOVAL OF ENDOMETRIAL POLYP AND SUTURE OF VAGINAL LACERATION (Bilateral) CYSTOSCOPY (N/A)  Subjective: Patient reports tolerating PO and no problems voiding.   Ambulating in the hall.  Desires discharge to home.  Objective: I have reviewed patient's vital signs, intake and output and labs. Vitals:   07/14/19 1330 07/14/19 1721  BP: 131/76 139/71  Pulse: 65 81  Resp: 20 20  Temp: (!) 97.5 F (36.4 C) 98.5 F (36.9 C)  SpO2: 95% 96%   I/O - 2910 cc/825 cc.  CBC    Component Value Date/Time   WBC 15.3 (H) 07/14/2019 1445   RBC 4.54 07/14/2019 1445   HGB 12.2 07/14/2019 1445   HGB 12.5 06/25/2019 0954   HCT 38.8 07/14/2019 1445   HCT 40.1 06/25/2019 0954   PLT 254 07/14/2019 1445   PLT 325 06/25/2019 0954   MCV 85.5 07/14/2019 1445   MCV 84 06/25/2019 0954   MCH 26.9 07/14/2019 1445   MCHC 31.4 07/14/2019 1445   RDW 15.7 (H) 07/14/2019 1445   RDW 16.9 (H) 06/25/2019 0954   LYMPHSABS 1.7 06/04/2019 0942   MONOABS 0.4 05/10/2019 1044   EOSABS 0.3 06/04/2019 0942   BASOSABS 0.0 06/04/2019 0942     General: alert and cooperative Resp: clear to auscultation bilaterally Cardio: regular rate and rhythm, S1, S2 normal, no murmur, click, rub or gallop GI: soft, non-tender; bowel sounds normal; no masses,  no organomegaly and incision: clean, dry and intact Extremities: Ted hose on.  DPs 2+ bilaterally.  Vaginal Bleeding: none  Assessment: s/p Procedure(s): TOTAL LAPAROSCOPIC HYSTERECTOMY WITH BILATERAL SALPINGECTOMY W/ VAGINAL REMOVAL OF ENDOMETRIAL POLYP AND SUTURE OF VAGINAL LACERATION (Bilateral) CYSTOSCOPY (N/A): progressing well  Ready for discharge.  Plan: Surgical findings and procedure discussed with patient.  Discharge to home.  Rx for Percocet and Motrin.  Instructions and precautions reviewed.  Questions invited and answered. FU in 1 week.    LOS: 0 days    Arloa Koh 07/14/2019, 6:19 PM

## 2019-07-14 NOTE — Progress Notes (Signed)
Update to History and Physical  No marked change in status since office pre-op visit.   Patient examined.   OK to proceed with surgery. 

## 2019-07-14 NOTE — Transfer of Care (Signed)
  Last Vitals:  Vitals Value Taken Time  BP 128/76 07/14/19 1031  Temp 36.6 C 07/14/19 1029  Pulse 80 07/14/19 1035  Resp 17 07/14/19 1035  SpO2 100 % 07/14/19 1035  Vitals shown include unvalidated device data.  Last Pain:  Vitals:   07/14/19 0609  TempSrc: Oral  PainSc: 0-No pain      Patients Stated Pain Goal: 5 (07/14/19 [id number])  Immediate Anesthesia Transfer of Care Note  Patient: Robin Meadows  Procedure(s) Performed: Procedure(s) (LRB): TOTAL LAPAROSCOPIC HYSTERECTOMY WITH BILATERAL SALPINGECTOMY W/ VAGINAL REMOVAL OF ENDOMETRIAL POLYP AND SUTURE OF VAGINAL LACERATION (Bilateral) CYSTOSCOPY (N/A)  Patient Location: PACU  Anesthesia Type: General  Level of Consciousness: awake, alert  and oriented  Airway & Oxygen Therapy: Patient Spontanous Breathing and Patient connected to face mask oxygen  Post-op Assessment: Report given to PACU RN and Post -op Vital signs reviewed and stable  Post vital signs: Reviewed and stable  Complications: No apparent anesthesia complications

## 2019-07-14 NOTE — Anesthesia Postprocedure Evaluation (Signed)
Anesthesia Post Note  Patient: Robin Meadows  Procedure(s) Performed: TOTAL LAPAROSCOPIC HYSTERECTOMY WITH BILATERAL SALPINGECTOMY W/ VAGINAL REMOVAL OF ENDOMETRIAL POLYP AND SUTURE OF VAGINAL LACERATION (Bilateral Abdomen) CYSTOSCOPY (N/A Bladder)     Patient location during evaluation: PACU Anesthesia Type: General Level of consciousness: sedated Pain management: pain level controlled Vital Signs Assessment: post-procedure vital signs reviewed and stable Respiratory status: spontaneous breathing Cardiovascular status: stable Postop Assessment: no apparent nausea or vomiting Anesthetic complications: no    Last Vitals:  Vitals:   07/14/19 0609 07/14/19 1029  BP: 139/86 (!) 119/107  Pulse: 84 84  Resp: 18 (!) 22  Temp: 36.8 C 36.6 C  SpO2: 99% 100%    Last Pain:  Vitals:   07/14/19 1029  TempSrc:   PainSc: 0-No pain   Pain Goal: Patients Stated Pain Goal: 5 (07/14/19 0609)                 Huston Foley

## 2019-07-14 NOTE — Discharge Instructions (Signed)

## 2019-07-16 LAB — SURGICAL PATHOLOGY

## 2019-07-21 ENCOUNTER — Ambulatory Visit (INDEPENDENT_AMBULATORY_CARE_PROVIDER_SITE_OTHER): Payer: BC Managed Care – PPO | Admitting: Obstetrics and Gynecology

## 2019-07-21 ENCOUNTER — Other Ambulatory Visit: Payer: Self-pay

## 2019-07-21 ENCOUNTER — Encounter: Payer: Self-pay | Admitting: Obstetrics and Gynecology

## 2019-07-21 ENCOUNTER — Telehealth: Payer: Self-pay | Admitting: Obstetrics and Gynecology

## 2019-07-21 VITALS — BP 108/66 | HR 64 | Temp 97.0°F | Ht 70.0 in | Wt 308.0 lb

## 2019-07-21 DIAGNOSIS — I808 Phlebitis and thrombophlebitis of other sites: Secondary | ICD-10-CM

## 2019-07-21 DIAGNOSIS — Z9889 Other specified postprocedural states: Secondary | ICD-10-CM

## 2019-07-21 NOTE — Telephone Encounter (Signed)
Call to patient, left detailed message, ok per dpr.  Advised OV scheduled for 07/23/19 at 4:30pm with Dr. Quincy Simmonds, arrive at 4:15pm.  Return call to office if any additional questions/concerns.   Routing to Dr. Quincy Simmonds.   Encounter closed.

## 2019-07-21 NOTE — Telephone Encounter (Signed)
Patient needs to a 2 day follow appointment with Dr.Silva 07/23/19. She asked for a late pm appointment. To triage to assist with 4:30pm appointment if possible.

## 2019-07-21 NOTE — Patient Instructions (Signed)
Phlebitis Phlebitis is soreness and swelling (inflammation) of a vein. This can occur in your arms, legs, or torso (trunk), as well as deeper inside your body. Phlebitis is usually not serious when it occurs close to the surface of the body. However, it can cause serious problems when it occurs deeper inside the body. What are the causes? Phlebitis can be caused by:  Having a needle, IV tube, or long, thin tube (catheter) put in the vein.  Getting certain medicines or solutions through an IV tube. Some medicines or solutions, such as antibiotics and cancer medicines, can irritate the vein.  Having an IV tube for a long period of time.  Having an IV placed on the part of the arm or hand that moves a lot.  A blood clot.  Infection of the vein.  Surgery on a vein. What increases the risk? The following factors may make you more likely to develop this condition:  Being overweight or obese.  Pregnancy.  Cancer.  Reduced or slowing of blood flow through your veins. This can be caused by: ? Bed rest over a long period of time. ? Long distance travel. ? Injury. ? Surgery. ? Congestive heart failure. ? Inactivity.  Smoking.  Taking birth control pills or hormone replacement therapy.  Varicose veins.  Inflammatory diseases or blood disorders that increase clotting.  Taking illegal drugs through the vein.  Having a history of blood clots. What are the signs or symptoms? Symptoms of this condition include:  A red, tender, swollen, and painful area on your skin. Usually, the area is long and narrow, and may spread.  The affected area feeling warm to the touch.  Firmness along the center of the affected area.  Low fever. How is this diagnosed? This condition is diagnosed based on:  Your symptoms.  A physical exam.  Your medical history, including your family history. Other tests may be done to rule out other conditions, such as blood clots, especially if you have a  history of blood clots or are at higher risk of developing blood clots. These may include:  Blood tests.  Ultrasound exam.  Genetic tests.  Biopsy. This is when a tissue is taken from the body for examination. This is rare. How is this treated? Treatment depends on the severity of the condition as well as the location of the inflammation. In most cases, the condition is minor and gets better quickly. Treatment may include:  Applying a warm compress or heating pad.  Wearing compression stockings or bandages.  Medicines, such as: ? Anti-inflammatory medicines. ? Antibiotic medicines if an infection is present. ? Blood-thinning medicines if a blood clot is suspected or present, or if you have a history of blood clots or a blood disorder.  Removing an IV tube that may be causing the problem.  Using a different medicine or solution that will not irritate the vein. In rare cases, surgery may be needed to remove a damaged section of a vein. Follow these instructions at home: Managing pain, stiffness, and swelling  If directed, apply heat to the affected area as often as told by your health care provider. Use the heat source that your health care provider recommends, such as a moist heat pack or a heating pad. ? Place a towel between your skin and the heat source. ? Leave the heat on for 20-30 minutes. ? Remove the heat if your skin turns bright red. This is especially important if you are unable to feel pain, heat,   or cold. You may have a greater risk of getting burned.  Raise (elevate) the affected area above the level of your heart while you are sitting or lying down. Medicines  Take over-the-counter and prescription medicines only as told by your health care provider.  If you were prescribed an antibiotic, take it as told by your health care provider. Do not stop using the antibiotic even if you start to feel better.  Carry a medical alert card or wear your medical alert jewelry  to show that you are on blood thinners, if this applies. General instructions   If you have phlebitis in your legs: ? Avoid sitting or standing for a long time. ? Keep your legs moving. ? Try to take short walks to break up long periods of sitting. ? Try to avoid bed rest that lasts for long periods of time. Regular sleep is not part of bed rest.  Wear compression stockings as told by your health care provider. These stockings reduce swelling in your legs and help to prevent blood clots. They also help to prevent the condition from coming back.  Do not use any products that contain nicotine or tobacco, such as cigarettes and e-cigarettes. If you need help quitting, ask your health care provider.  Keep all follow-up visits as told by your health care provider. This is important. This may include any follow-up blood tests. Contact a health care provider if:  You have unusual bruising or any bleeding problems.  Your symptoms do not get better, or your symptoms get worse.  You are on anti-inflammatory medicines and you develop abdominal pain. Get help right away if:  You suddenly have chest pain or trouble breathing.  You have a fever and your symptoms suddenly get worse.  You cough up blood.  You feel lightheaded or pass out.  You have severe pain and swelling in the affected arm or leg. These symptoms may represent a serious problem that is an emergency. Do not wait to see if the symptoms will go away. Get medical help right away. Call your local emergency services (911 in the [place] Do not drive yourself to the hospital. Summary  Phlebitis is soreness and swelling (inflammation) of a vein.  Phlebitis is usually not serious when it occurs close to the surface of the body. However, it can cause serious problems when it occurs deeper inside the body.  Treatment depends on the severity of the condition and the location of the inflammation.  Raise (elevate) the affected area above  the level of your heart while you are sitting or lying down. This information is not intended to replace advice given to you by your health care provider. Make sure you discuss any questions you have with your health care provider. Document Revised: 04/15/2018 Document Reviewed: 04/10/2016 [employer] Patient Education  2020 [employer]  

## 2019-07-21 NOTE — Progress Notes (Signed)
GYNECOLOGY  VISIT   HPI: 44 y.o.   Married  Caucasian  female   [phone] with Patient's last menstrual period was 06/22/2019 (approximate).   here for 1 week post op TOTAL LAPAROSCOPIC HYSTERECTOMY WITH BILATERAL SALPINGECTOMY W/ VAGINAL REMOVAL OF ENDOMETRIAL POLYP AND SUTURE OF VAGINAL LACERATION (Bilateral Abdomen) CYSTOSCOPY (N/A Bladder)     Having an occasional hot flash and has lightheadnedess.  Has some gas pains.  She is passing gas from below.  She has had a BM since surgery.  Voiding well.  No vaginal bleeding.  States she is taking one Percocet midday and then Ibuprofen at hs.   GYNECOLOGIC HISTORY: Patient's last menstrual period was 06/22/2019 (approximate). Contraception:  Hysterectomy -- ovaries remain Menopausal hormone therapy:  none Last mammogram:  06-04-19 3D/Neg/density B/BiRads1 Last pap smear:   05-19-19 Neg:Neg HR HPV,2019 normal per patient        OB History    Gravida  3   Para  1   Term  1   Preterm      AB  2   Living  1     SAB  2   TAB      Ectopic      Multiple      Live Births                 Patient Active Problem List   Diagnosis Date Noted  . Status post laparoscopic hysterectomy 07/14/2019    Past Medical History:  Diagnosis Date  . Abnormal uterine bleeding    endometrial polyp  . Anemia   . Ovarian cyst    left  . Vertigo     Past Surgical History:  Procedure Laterality Date  . CYSTOSCOPY N/A 07/14/2019   Procedure: CYSTOSCOPY;  Surgeon: [name], MD;  Location: [facility];  Service: Gynecology;  Laterality: N/A;  . TOTAL LAPAROSCOPIC HYSTERECTOMY WITH SALPINGECTOMY Bilateral 07/14/2019   Procedure: TOTAL LAPAROSCOPIC HYSTERECTOMY WITH BILATERAL SALPINGECTOMY W/ VAGINAL REMOVAL OF ENDOMETRIAL POLYP AND SUTURE OF VAGINAL LACERATION;  Surgeon: [name], MD;  Location: [place];  Service: Gynecology;  Laterality: Bilateral;  . WISDOM TOOTH  EXTRACTION      Current Outpatient Medications  Medication Sig Dispense Refill  . ibuprofen (ADVIL) 800 MG tablet Take 1 tablet (800 mg total) by mouth every 8 (eight) hours as needed for mild pain. 30 tablet 0  . oxyCODONE-acetaminophen (PERCOCET/ROXICET) 5-325 MG tablet Take 1-2 tablets by mouth every 4 (four) hours as needed for moderate pain. 30 tablet 0   No current facility-administered medications for this visit.     ALLERGIES: Patient has no known allergies.  Family History  Problem Relation Age of Onset  . Cancer Mother        ?cervical ca--dec age 15  . Cancer Father        dec with colon ca age 29  . Hypertension Maternal Grandfather   . Cancer Maternal Grandfather        dec Lung cancer  . Diabetes Maternal Grandmother     Social History   Socioeconomic History  . Marital status: Married    Spouse name: Not on file  . Number of children: Not on file  . Years of education: Not on file  . Highest education level: Not on file  Occupational History  . Not on file  Tobacco Use  . Smoking status: Never Smoker  . Smokeless tobacco: Never Used  Substance and Sexual Activity  . Alcohol use: Never  . Drug use: Never  . Sexual activity: Yes    Birth control/protection: None, Surgical    Comment: Hysterectomy  Other Topics Concern  . Not on file  Social History Narrative  . Not on file   Social Determinants of Health   Financial Resource Strain:   . Difficulty of Paying Living Expenses:   Food Insecurity:   . Worried About Charity fundraiser in the Last Year:   . Arboriculturist in the Last Year:   Transportation Needs:   . Film/video editor (Medical):   Marland Kitchen Lack of Transportation (Non-Medical):   Physical Activity:   . Days of Exercise per Week:   . Minutes of Exercise per Session:   Stress:   . Feeling of Stress :   Social Connections:   . Frequency of Communication with Friends and Family:   . Frequency of Social Gatherings with Friends and  Family:   . Attends Religious Services:   . Active Member of Clubs or Organizations:   . Attends Archivist Meetings:   Marland Kitchen Marital Status:   Intimate Partner Violence:   . Fear of Current or Ex-Partner:   . Emotionally Abused:   Marland Kitchen Physically Abused:   . Sexually Abused:     Review of Systems  Constitutional: Negative.   HENT: Negative.   Eyes: Negative.   Respiratory: Negative.   Cardiovascular: Negative.   Gastrointestinal: Negative.   Endocrine: Negative.   Genitourinary: Negative.   Musculoskeletal: Negative.   Skin: Negative.   Allergic/Immunologic: Negative.   Neurological: Negative.   Hematological: Negative.   Psychiatric/Behavioral: Negative.     PHYSICAL EXAMINATION:    BP 108/66 (BP Location: Left Arm, Patient Position: Sitting, Cuff Size: Large)   Pulse 64   Temp (!) 97 F (36.1 C) (Temporal)   Ht 5\' 10"  (1.778 m)   Wt (!) 308 lb (139.7 kg)   LMP 06/22/2019 (Approximate)   BMI 44.19 kg/m     General appearance: alert, cooperative and appears stated age   Abdomen: soft, non-tender, no masses,  no organomegaly Ext:  Left hand with superficial vein that is firm and tender to touch.  No erythema of hand.  ASSESSMENT  Status post total laparoscopic hysterectomy with bilateral salpingectomy, removal of endometrial polyp, cystoscopy.  Left ovarian cyst resolved at the time of surgery.  Left hand thrombophlebitis.   PLAN  I reviewed surgical care, findings, and pathology report.  Ibuprofen 800 mg po q 8 hours and heat compress to hand.  FU in 48 hours, sooner as needed.  Call for increasing pain, development of redness of hand/arm, or arm swelling.  She will also need her 6 week post op appointment.  I told her that her ovaries can be checked at any time if we have concern about recurrent cysts.   An After Visit Summary was printed and given to the patient.

## 2019-07-22 ENCOUNTER — Other Ambulatory Visit: Payer: Self-pay

## 2019-07-23 ENCOUNTER — Encounter: Payer: Self-pay | Admitting: Obstetrics and Gynecology

## 2019-07-23 ENCOUNTER — Ambulatory Visit (INDEPENDENT_AMBULATORY_CARE_PROVIDER_SITE_OTHER): Payer: BC Managed Care – PPO | Admitting: Obstetrics and Gynecology

## 2019-07-23 VITALS — BP 118/68 | HR 72 | Temp 97.2°F | Ht 70.0 in | Wt 308.0 lb

## 2019-07-23 DIAGNOSIS — I808 Phlebitis and thrombophlebitis of other sites: Secondary | ICD-10-CM

## 2019-07-23 NOTE — Progress Notes (Signed)
GYNECOLOGY  VISIT   HPI: 44 y.o.   Married  Caucasian  female   [phone] with Patient's last menstrual period was 06/22/2019.   here for 2 day f/u of superficial thrombophlebitis of the left hand at IV site.  Left hand feels better since using heat.  Not as sore.  She is taking 800 mg every 8 hours.   Denies swelling or tightness of her arm.   She denies shortness of breath.  She has a tickle occasionally in her throat.  Denies chest discomfort.   GYNECOLOGIC HISTORY: Patient's last menstrual period was 06/22/2019. Contraception:  Hysterectomy -- ovaries remain Menopausal hormone therapy:  none Last mammogram:  06-04-19 3D/Neg/density B/BiRads1 Last pap smear:   05-19-19 Neg:Neg HR HPV,2019 normal per patient        OB History    Gravida  3   Para  1   Term  1   Preterm      AB  2   Living  1     SAB  2   TAB      Ectopic      Multiple      Live Births                 Patient Active Problem List   Diagnosis Date Noted  . Status post laparoscopic hysterectomy 07/14/2019    Past Medical History:  Diagnosis Date  . Abnormal uterine bleeding    endometrial polyp  . Anemia   . Ovarian cyst    left  . Vertigo     Past Surgical History:  Procedure Laterality Date  . CYSTOSCOPY N/A 07/14/2019   Procedure: CYSTOSCOPY;  Surgeon: [name], MD;  Location: [facility];  Service: Gynecology;  Laterality: N/A;  . TOTAL LAPAROSCOPIC HYSTERECTOMY WITH SALPINGECTOMY Bilateral 07/14/2019   Procedure: TOTAL LAPAROSCOPIC HYSTERECTOMY WITH BILATERAL SALPINGECTOMY W/ VAGINAL REMOVAL OF ENDOMETRIAL POLYP AND SUTURE OF VAGINAL LACERATION;  Surgeon: [name], MD;  Location: [place];  Service: Gynecology;  Laterality: Bilateral;  . WISDOM TOOTH EXTRACTION      Current Outpatient Medications  Medication Sig Dispense Refill  . ibuprofen (ADVIL) 800 MG tablet Take 1 tablet (800 mg total) by mouth every 8  (eight) hours as needed for mild pain. 30 tablet 0  . oxyCODONE-acetaminophen (PERCOCET/ROXICET) 5-325 MG tablet Take 1-2 tablets by mouth every 4 (four) hours as needed for moderate pain. 30 tablet 0   No current facility-administered medications for this visit.     ALLERGIES: Patient has no known allergies.  Family History  Problem Relation Age of Onset  . Cancer Mother        ?cervical ca--dec age 57  . Cancer Father        dec with colon ca age 32  . Hypertension Maternal Grandfather   . Cancer Maternal Grandfather        dec Lung cancer  . Diabetes Maternal Grandmother     Social History   Socioeconomic History  . Marital status: Married    Spouse name: Not on file  . Number of children: Not on file  . Years of education: Not on file  . Highest education level: Not on file  Occupational History  . Not on file  Tobacco Use  . Smoking status: Never Smoker  . Smokeless tobacco: Never Used  Substance and Sexual Activity  . Alcohol use: Never  . Drug use: Never  . Sexual  activity: Yes    Birth control/protection: None, Surgical    Comment: Hysterectomy  Other Topics Concern  . Not on file  Social History Narrative  . Not on file   Social Determinants of Health   Financial Resource Strain:   . Difficulty of Paying Living Expenses:   Food Insecurity:   . Worried About Charity fundraiser in the Last Year:   . Arboriculturist in the Last Year:   Transportation Needs:   . Film/video editor (Medical):   Marland Kitchen Lack of Transportation (Non-Medical):   Physical Activity:   . Days of Exercise per Week:   . Minutes of Exercise per Session:   Stress:   . Feeling of Stress :   Social Connections:   . Frequency of Communication with Friends and Family:   . Frequency of Social Gatherings with Friends and Family:   . Attends Religious Services:   . Active Member of Clubs or Organizations:   . Attends Archivist Meetings:   Marland Kitchen Marital Status:   Intimate  Partner Violence:   . Fear of Current or Ex-Partner:   . Emotionally Abused:   Marland Kitchen Physically Abused:   . Sexually Abused:     Review of Systems  Constitutional: Negative.   HENT: Negative.   Eyes: Negative.   Respiratory: Negative.   Cardiovascular: Negative.   Gastrointestinal: Negative.   Endocrine: Negative.   Genitourinary: Negative.   Musculoskeletal: Negative.   Skin: Negative.   Allergic/Immunologic: Negative.   Neurological: Negative.   Hematological: Negative.   Psychiatric/Behavioral: Negative.     PHYSICAL EXAMINATION:    BP 118/68 (BP Location: Right Arm, Patient Position: Sitting, Cuff Size: Large)   Pulse 72   Temp (!) 97.2 F (36.2 C) (Temporal)   Ht 5\' 10"  (1.778 m)   Wt (!) 308 lb (139.7 kg)   LMP 06/22/2019   BMI 44.19 kg/m     General appearance: alert, cooperative and appears stated age   Extremities: left hand with reduced firmness of superficial vein.  Less length of the vein is involved. No edema of the left upper extremity.  No erythema or streaking noted.   ASSESSMENT  Left hand thrombophlebitis.  Improved with NSAIDs and heat.   PLAN  Continue current regimen of heat and Ibuprofen.  She understands there is a rare risk of thrombosis of the deep veins of the arm or pulmonary embolus associated with superficial thrombosis at an IV catheter site.  She will present to the ER for swelling or redness of her arm, shortness of breath, cough, or any other concern.  Fu for 6 week post op check.   An After Visit Summary was printed and given to the patient.

## 2019-07-26 NOTE — Discharge Summary (Signed)
Physician Discharge Summary  Patient ID: Robin Meadows MRN: [id number] DOB/AGE: [birth year] 44 y.o.  Admit date: 07/14/2019 Discharge date: 07/14/19  Admission Diagnoses: 1.  Menorrhagia with irregular menses 2.  Endometrial polyp 3.  Left ovarian cysts.  Discharge Diagnoses:  1.  Menorrhagia with irregular menses 2.  Endometrial polyp 3.  Status post total laparoscopic hysterectomy with bilateral salpingectomy,cystoscopy, vaginal removal of endometrial polyp, and repair of small vaginal laceration.   Active Problems:   Status post laparoscopic hysterectomy   Discharged Condition: good  Hospital Course:  The patient was admitted on 07/14/19 at which time she had a total laparoscopic hysterectomy with bilateral salpingectomy,cystoscopy, vaginal removal of endometrial polyp, along with suturing of a small left vaginal wall laceration which were performed while under general anesthesia.  The patient's post op course was uneventful.  She began taking Percocet and Motrin on post op day one when she was taking po well.  She ambulated independently and wore PAS and Ted hose for DVT prophylaxis while in bed.  She received Lovenox preop.  Her foley catheter catheter was removed intraoperatively at the termination of the procedure, and she was able to void well post op. The patient's vital signs remained stable and she demonstrated no signs of infection during her hospitalization.  The patient's post op day zero Hgb was 12.2, and her WBC was 15.3.  The leukocytosis was attributed to surgical dissection.  She had very minimal vaginal bleeding, and her incisions demonstrated no signs of erythema or significant drainage.  She was found to be in good condition and ready for discharge on post op day zero.  Consults: None  Significant Diagnostic Studies: labs:  See Hospital Course  Treatments: surgery:   Total laparoscopic hysterectomy with bilateral salpingectomy,cystoscopy, vaginal removal  of endometrial polyp, and repair of small vaginal laceration.    Discharge Exam: Blood pressure 139/71, pulse 81, temperature 98.5 F (36.9 C), resp. rate 20, height 5\' 11"  (1.803 m), weight (!) 141.4 kg, last menstrual period 06/22/2019, SpO2 96 %. General: alert and cooperative Resp: clear to auscultation bilaterally Cardio: regular rate and rhythm, S1, S2 normal, no murmur, click, rub or gallop GI: soft, non-tender; bowel sounds normal; no masses,  no organomegaly and incision: clean, dry and intact Extremities: Ted hose on.  DPs 2+ bilaterally.  Vaginal Bleeding: none  Disposition: Discharge disposition: 01-Home or Self Care      Post op instructions were reviewed in verbal and written form.   Allergies as of 07/14/2019   No Known Allergies     Medication List    STOP taking these medications   Iron 325 (65 Fe) MG Tabs     TAKE these medications   ibuprofen 800 MG tablet Commonly known as: ADVIL Take 1 tablet (800 mg total) by mouth every 8 (eight) hours as needed for mild pain. Notes to patient: Take next dose at midnight tonight   oxyCODONE-acetaminophen 5-325 MG tablet Commonly known as: PERCOCET/ROXICET Take 1-2 tablets by mouth every 4 (four) hours as needed for moderate pain. Notes to patient: Take next dose at 8:30 pm tonight      Follow-up Information    [name], MD In 1 week.   Specialty: Obstetrics and Gynecology Contact information: [address] [place] [address] [phone]           Signed: Arloa Koh 07/26/2019, 2:57 PM

## 2019-08-25 ENCOUNTER — Encounter: Payer: Self-pay | Admitting: Obstetrics and Gynecology

## 2019-08-25 ENCOUNTER — Other Ambulatory Visit: Payer: Self-pay

## 2019-08-25 ENCOUNTER — Ambulatory Visit (INDEPENDENT_AMBULATORY_CARE_PROVIDER_SITE_OTHER): Payer: BC Managed Care – PPO | Admitting: Obstetrics and Gynecology

## 2019-08-25 VITALS — BP 130/92 | HR 88 | Temp 97.3°F | Resp 12 | Ht 70.0 in | Wt 309.4 lb

## 2019-08-25 DIAGNOSIS — R21 Rash and other nonspecific skin eruption: Secondary | ICD-10-CM

## 2019-08-25 DIAGNOSIS — Z9889 Other specified postprocedural states: Secondary | ICD-10-CM

## 2019-08-25 NOTE — Progress Notes (Signed)
GYNECOLOGY  VISIT   HPI: 44 y.o.   Married  Caucasian  female   [phone] with Patient's last menstrual period was 06/22/2019.   here for 6 week follow up status post TOTAL LAPAROSCOPIC HYSTERECTOMY WITH BILATERAL SALPINGECTOMY W/ VAGINAL REMOVAL OF ENDOMETRIAL POLYP AND SUTURE OF VAGINAL LACERATION (Bilateral Abdomen)  CYSTOSCOPY (N/A Bladder).  Feeling good.  No concerns.  No vaginal bleeding.    No more swelling in her left hand at the IV site.  She notes a rash on her arms that gets worse in the summer.  No irritation.   GYNECOLOGIC HISTORY: Patient's last menstrual period was 06/22/2019. Contraception:  Hyst--ovaries remain Menopausal hormone therapy:  none Last mammogram:  06-04-19 3D/Neg/density B/BiRads1  Last pap smear:   05-19-19 Neg:Neg HR HPV,2019 normal per patient        OB History    Gravida  3   Para  1   Term  1   Preterm      AB  2   Living  1     SAB  2   TAB      Ectopic      Multiple      Live Births                 Patient Active Problem List   Diagnosis Date Noted  . Status post laparoscopic hysterectomy 07/14/2019    Past Medical History:  Diagnosis Date  . Abnormal uterine bleeding    endometrial polyp  . Anemia   . Ovarian cyst    left  . Vertigo     Past Surgical History:  Procedure Laterality Date  . CYSTOSCOPY N/A 07/14/2019   Procedure: CYSTOSCOPY;  Surgeon: [name], MD;  Location: [facility];  Service: Gynecology;  Laterality: N/A;  . TOTAL LAPAROSCOPIC HYSTERECTOMY WITH SALPINGECTOMY Bilateral 07/14/2019   Procedure: TOTAL LAPAROSCOPIC HYSTERECTOMY WITH BILATERAL SALPINGECTOMY W/ VAGINAL REMOVAL OF ENDOMETRIAL POLYP AND SUTURE OF VAGINAL LACERATION;  Surgeon: [name], MD;  Location: [place];  Service: Gynecology;  Laterality: Bilateral;  . WISDOM TOOTH EXTRACTION      No current outpatient medications on file.   No current  facility-administered medications for this visit.     ALLERGIES: Patient has no known allergies.  Family History  Problem Relation Age of Onset  . Cancer Mother        ?cervical ca--dec age 35  . Cancer Father        dec with colon ca age 69  . Hypertension Maternal Grandfather   . Cancer Maternal Grandfather        dec Lung cancer  . Diabetes Maternal Grandmother     Social History   Socioeconomic History  . Marital status: Married    Spouse name: Not on file  . Number of children: Not on file  . Years of education: Not on file  . Highest education level: Not on file  Occupational History  . Not on file  Tobacco Use  . Smoking status: Never Smoker  . Smokeless tobacco: Never Used  Substance and Sexual Activity  . Alcohol use: Never  . Drug use: Never  . Sexual activity: Yes    Birth control/protection: None, Surgical    Comment: Hysterectomy  Other Topics Concern  . Not on file  Social History Narrative  . Not on file   Social Determinants of Health   Financial Resource Strain:   . Difficulty  of Paying Living Expenses:   Food Insecurity:   . Worried About Charity fundraiser in the Last Year:   . Arboriculturist in the Last Year:   Transportation Needs:   . Film/video editor (Medical):   Marland Kitchen Lack of Transportation (Non-Medical):   Physical Activity:   . Days of Exercise per Week:   . Minutes of Exercise per Session:   Stress:   . Feeling of Stress :   Social Connections:   . Frequency of Communication with Friends and Family:   . Frequency of Social Gatherings with Friends and Family:   . Attends Religious Services:   . Active Member of Clubs or Organizations:   . Attends Archivist Meetings:   Marland Kitchen Marital Status:   Intimate Partner Violence:   . Fear of Current or Ex-Partner:   . Emotionally Abused:   Marland Kitchen Physically Abused:   . Sexually Abused:     Review of Systems  All other systems reviewed and are negative.   PHYSICAL  EXAMINATION:    BP (!) 130/92 (BP Location: Right Arm, Patient Position: Sitting, Cuff Size: Large)   Pulse 88   Temp (!) 97.3 F (36.3 C) (Skin)   Resp 12   Ht 5\' 10"  (1.778 m)   Wt (!) 309 lb 6.4 oz (140.3 kg)   LMP 06/22/2019   BMI 44.39 kg/m     General appearance: alert, cooperative and appears stated age  Abdomen: soft, non-tender, no masses,  no organomegaly Arms:  Red slightly raised patches on arms.    Pelvic: External genitalia:  no lesions              Urethra:  normal appearing urethra with no masses, tenderness or lesions              Bartholins and Skenes: normal                 Vagina: normal appearing vagina with normal color and discharge, no lesions              Cervix:  absent.  Suture line intact.                 Bimanual Exam:  Uterus:  Absent.               Adnexa: no mass, fullness, tenderness             Chaperone was present for exam.  ASSESSMENT  Status post laparoscopic hysterectomy.  Doing well overall.  Skin rash on arms.   PLAN  Continue decreased activity.  Walking is ok.  FU in 3 weeks. No sexual activity for 12 weeks post op.  Referral to dermatology.    An After Visit Summary was printed and given to the patient.

## 2019-09-10 DIAGNOSIS — L400 Psoriasis vulgaris: Secondary | ICD-10-CM | POA: Diagnosis not present

## 2019-09-10 DIAGNOSIS — L7211 Pilar cyst: Secondary | ICD-10-CM | POA: Diagnosis not present

## 2019-09-16 NOTE — Progress Notes (Signed)
GYNECOLOGY  VISIT   HPI: 44 y.o.   Married  Caucasian  female   [phone] with Patient's last menstrual period was 06/22/2019.   here for 9 week follow up status post TOTAL LAPAROSCOPIC HYSTERECTOMY WITH BILATERAL SALPINGECTOMY W/ VAGINAL REMOVAL OF ENDOMETRIAL POLYP AND SUTURE OF VAGINAL LACERATION (Bilateral Abdomen) CYSTOSCOPY (N/A Bladder)  Feeling good overall.  No pain.  No vaginal bleeding.   \ GYNECOLOGIC HISTORY: Patient's last menstrual period was 06/22/2019. Contraception:  Hyst--ovaries remain Menopausal hormone therapy:  none Last mammogram: 06-04-19 3D/Neg/density B/BiRads1  Last pap smear: 05-19-19 Neg:Neg HR HPV,2019 normal per patient        OB History    Gravida  3   Para  1   Term  1   Preterm      AB  2   Living  1     SAB  2   TAB      Ectopic      Multiple      Live Births                 Patient Active Problem List   Diagnosis Date Noted  . Status post laparoscopic hysterectomy 07/14/2019    Past Medical History:  Diagnosis Date  . Abnormal uterine bleeding    endometrial polyp  . Anemia   . Ovarian cyst    left  . Vertigo     Past Surgical History:  Procedure Laterality Date  . CYSTOSCOPY N/A 07/14/2019   Procedure: CYSTOSCOPY;  Surgeon: [name], MD;  Location: [facility];  Service: Gynecology;  Laterality: N/A;  . TOTAL LAPAROSCOPIC HYSTERECTOMY WITH SALPINGECTOMY Bilateral 07/14/2019   Procedure: TOTAL LAPAROSCOPIC HYSTERECTOMY WITH BILATERAL SALPINGECTOMY W/ VAGINAL REMOVAL OF ENDOMETRIAL POLYP AND SUTURE OF VAGINAL LACERATION;  Surgeon: [name], MD;  Location: [place];  Service: Gynecology;  Laterality: Bilateral;  . WISDOM TOOTH EXTRACTION      No current outpatient medications on file.   No current facility-administered medications for this visit.     ALLERGIES: Patient has no known allergies.  Family History  Problem Relation Age of Onset   . Cancer Mother        ?cervical ca--dec age 21  . Cancer Father        dec with colon ca age 73  . Hypertension Maternal Grandfather   . Cancer Maternal Grandfather        dec Lung cancer  . Diabetes Maternal Grandmother     Social History   Socioeconomic History  . Marital status: Married    Spouse name: Not on file  . Number of children: Not on file  . Years of education: Not on file  . Highest education level: Not on file  Occupational History  . Not on file  Tobacco Use  . Smoking status: Never Smoker  . Smokeless tobacco: Never Used  Vaping Use  . Vaping Use: Never used  Substance and Sexual Activity  . Alcohol use: Never  . Drug use: Never  . Sexual activity: Yes    Birth control/protection: None, Surgical    Comment: Hysterectomy  Other Topics Concern  . Not on file  Social History Narrative  . Not on file   Social Determinants of Health   Financial Resource Strain:   . Difficulty of Paying Living Expenses:   Food Insecurity:   . Worried About Charity fundraiser in the Last Year:   .  Ran Out of Food in the Last Year:   Transportation Needs:   . Film/video editor (Medical):   Marland Kitchen Lack of Transportation (Non-Medical):   Physical Activity:   . Days of Exercise per Week:   . Minutes of Exercise per Session:   Stress:   . Feeling of Stress :   Social Connections:   . Frequency of Communication with Friends and Family:   . Frequency of Social Gatherings with Friends and Family:   . Attends Religious Services:   . Active Member of Clubs or Organizations:   . Attends Archivist Meetings:   Marland Kitchen Marital Status:   Intimate Partner Violence:   . Fear of Current or Ex-Partner:   . Emotionally Abused:   Marland Kitchen Physically Abused:   . Sexually Abused:     Review of Systems  All other systems reviewed and are negative.   PHYSICAL EXAMINATION:    BP 110/80 (Cuff Size: Large)   Pulse 76   Ht 5\' 9"  (1.753 m)   Wt (!) 307 lb 12.8 oz (139.6 kg)    LMP 06/22/2019   BMI 45.45 kg/m     General appearance: alert, cooperative and appears stated age  Pelvic: External genitalia:  no lesions              Urethra:  normal appearing urethra with no masses, tenderness or lesions              Bartholins and Skenes: normal                 Vagina: normal appearing vagina with normal color and discharge, suture present at vaginal cuff in midline.               Cervix: absent                Bimanual Exam:  Uterus:  absent              Adnexa: no mass, fullness, tenderness                Chaperone was present for exam.  ASSESSMENT  Status post laparoscopic hysterectomy, bilateral salpingectomy, vaginal removal of endometrial polyp, suturing of vaginal mucosa. Suture present in vaginal cuff.   PLAN  Ok to resume normal activities except for strenuous lifting and sexual activity.  FU in 3 weeks.  Call for any problems.

## 2019-09-17 ENCOUNTER — Encounter: Payer: Self-pay | Admitting: Obstetrics and Gynecology

## 2019-09-17 ENCOUNTER — Telehealth: Payer: Self-pay

## 2019-09-17 ENCOUNTER — Other Ambulatory Visit: Payer: Self-pay

## 2019-09-17 ENCOUNTER — Ambulatory Visit (INDEPENDENT_AMBULATORY_CARE_PROVIDER_SITE_OTHER): Payer: BC Managed Care – PPO | Admitting: Obstetrics and Gynecology

## 2019-09-17 VITALS — BP 110/80 | HR 76 | Ht 69.0 in | Wt 307.8 lb

## 2019-09-17 DIAGNOSIS — Z9889 Other specified postprocedural states: Secondary | ICD-10-CM

## 2019-09-17 NOTE — Telephone Encounter (Signed)
Patient called and confirmed appointment

## 2019-09-17 NOTE — Telephone Encounter (Signed)
Patient needs a 3 week follow up per Dr. Quincy Simmonds from today's date (09/17/19). No appointments available around that time, need triage to assist.

## 2019-09-17 NOTE — Telephone Encounter (Signed)
Call to patient, left detailed message, ok per dpr. OV scheduled for 10/12/19 at 4:30pm with Dr. Quincy Simmonds. Please return call to office to confirm.

## 2019-09-17 NOTE — Telephone Encounter (Signed)
Encounter closed

## 2019-10-01 DIAGNOSIS — L400 Psoriasis vulgaris: Secondary | ICD-10-CM | POA: Diagnosis not present

## 2019-10-12 ENCOUNTER — Ambulatory Visit (INDEPENDENT_AMBULATORY_CARE_PROVIDER_SITE_OTHER): Payer: BC Managed Care – PPO | Admitting: Obstetrics and Gynecology

## 2019-10-12 ENCOUNTER — Encounter: Payer: Self-pay | Admitting: Obstetrics and Gynecology

## 2019-10-12 ENCOUNTER — Other Ambulatory Visit: Payer: Self-pay

## 2019-10-12 VITALS — BP 110/72 | HR 76 | Ht 69.0 in | Wt 305.8 lb

## 2019-10-12 DIAGNOSIS — Z90710 Acquired absence of both cervix and uterus: Secondary | ICD-10-CM

## 2019-10-12 NOTE — Progress Notes (Signed)
GYNECOLOGY  VISIT   HPI: 44 y.o.   Married  Caucasian  female   [phone] with Patient's last menstrual period was 06/22/2019.   here for 12 weeks status post TOTAL LAPAROSCOPIC HYSTERECTOMY WITH BILATERAL SALPINGECTOMY W/ VAGINAL REMOVAL OF ENDOMETRIAL POLYP AND SUTURE OF VAGINAL LACERATION (Bilateral Abdomen) CYSTOSCOPY (N/A Bladder).  07/14/19.   Denies any bleeding or pain.  Feels lighter and not sluggish.     GYNECOLOGIC HISTORY: Patient's last menstrual period was 06/22/2019. Contraception:  Hyst--ovaries remain Menopausal hormone therapy:  none Last mammogram:  06-04-19 3D/Neg/density B/BiRads1 Last pap smear:    05-19-19 Neg:Neg HR HPV,2019 normal per patient      OB History    Gravida  3   Para  1   Term  1   Preterm      AB  2   Living  1     SAB  2   TAB      Ectopic      Multiple      Live Births                 Patient Active Problem List   Diagnosis Date Noted  . Status post laparoscopic hysterectomy 07/14/2019    Past Medical History:  Diagnosis Date  . Abnormal uterine bleeding    endometrial polyp  . Anemia   . Ovarian cyst    left  . Vertigo     Past Surgical History:  Procedure Laterality Date  . CYSTOSCOPY N/A 07/14/2019   Procedure: CYSTOSCOPY;  Surgeon: [name], MD;  Location: [facility];  Service: Gynecology;  Laterality: N/A;  . TOTAL LAPAROSCOPIC HYSTERECTOMY WITH SALPINGECTOMY Bilateral 07/14/2019   Procedure: TOTAL LAPAROSCOPIC HYSTERECTOMY WITH BILATERAL SALPINGECTOMY W/ VAGINAL REMOVAL OF ENDOMETRIAL POLYP AND SUTURE OF VAGINAL LACERATION;  Surgeon: [name], MD;  Location: [facility];  Service: Gynecology;  Laterality: Bilateral;  . WISDOM TOOTH EXTRACTION      Current Outpatient Medications  Medication Sig Dispense Refill  . clobetasol (TEMOVATE) 0.05 % external solution SMARTSIG:1 Milliliter(s) Topical Every Night    . triamcinolone cream (KENALOG) 0.1 %  Apply topically 2 (two) times daily as needed.     No current facility-administered medications for this visit.     ALLERGIES: Patient has no known allergies.  Family History  Problem Relation Age of Onset  . Cancer Mother        ?cervical ca--dec age 1  . Cancer Father        dec with colon ca age 7  . Hypertension Maternal Grandfather   . Cancer Maternal Grandfather        dec Lung cancer  . Diabetes Maternal Grandmother     Social History   Socioeconomic History  . Marital status: Married    Spouse name: Not on file  . Number of children: Not on file  . Years of education: Not on file  . Highest education level: Not on file  Occupational History  . Not on file  Tobacco Use  . Smoking status: Never Smoker  . Smokeless tobacco: Never Used  Vaping Use  . Vaping Use: Never used  Substance and Sexual Activity  . Alcohol use: Never  . Drug use: Never  . Sexual activity: Yes    Birth control/protection: None, Surgical    Comment: Hysterectomy  Other Topics Concern  . Not on file  Social History Narrative  . Not on file  Social Determinants of Health   Financial Resource Strain:   . Difficulty of Paying Living Expenses:   Food Insecurity:   . Worried About Charity fundraiser in the Last Year:   . Arboriculturist in the Last Year:   Transportation Needs:   . Film/video editor (Medical):   Marland Kitchen Lack of Transportation (Non-Medical):   Physical Activity:   . Days of Exercise per Week:   . Minutes of Exercise per Session:   Stress:   . Feeling of Stress :   Social Connections:   . Frequency of Communication with Friends and Family:   . Frequency of Social Gatherings with Friends and Family:   . Attends Religious Services:   . Active Member of Clubs or Organizations:   . Attends Archivist Meetings:   Marland Kitchen Marital Status:   Intimate Partner Violence:   . Fear of Current or Ex-Partner:   . Emotionally Abused:   Marland Kitchen Physically Abused:   . Sexually  Abused:     Review of Systems  All other systems reviewed and are negative.   PHYSICAL EXAMINATION:    BP 110/72 (Cuff Size: Large)   Pulse 76   Ht 5\' 9"  (1.753 m)   Wt (!) 305 lb 12.8 oz (138.7 kg)   LMP 06/22/2019   BMI 45.16 kg/m     General appearance: alert, cooperative and appears stated age   Pelvic: External genitalia:  no lesions              Urethra:  normal appearing urethra with no masses, tenderness or lesions              Bartholins and Skenes: normal                 Vagina: normal appearing vagina with normal color and discharge, no lesions              Cervix: absent.  Suture still present at the top of the vagina, but detaches from the vaginal mucosa.                 Bimanual Exam:  Uterus:  Absent.               Adnexa: no mass, fullness, tenderness                Chaperone was present for exam.  ASSESSMENT  Status post laparoscopic hysterectomy.  Doing well.   PLAN  Wait 2 more weeks before returning to all normal activity. Return for annual exam and prn.

## 2019-10-25 ENCOUNTER — Ambulatory Visit (HOSPITAL_COMMUNITY)
Admission: EM | Admit: 2019-10-25 | Discharge: 2019-10-25 | Disposition: A | Discharge status: Home / Self Care | Payer: BC Managed Care – PPO | Attending: Emergency Medicine | Admitting: Emergency Medicine

## 2019-10-25 ENCOUNTER — Encounter (HOSPITAL_COMMUNITY): Payer: Self-pay | Admitting: *Deleted

## 2019-10-25 ENCOUNTER — Other Ambulatory Visit: Payer: Self-pay

## 2019-10-25 DIAGNOSIS — M545 Low back pain: Secondary | ICD-10-CM

## 2019-10-25 DIAGNOSIS — R358 Other polyuria: Secondary | ICD-10-CM | POA: Diagnosis not present

## 2019-10-25 DIAGNOSIS — R350 Frequency of micturition: Secondary | ICD-10-CM

## 2019-10-25 DIAGNOSIS — S39012A Strain of muscle, fascia and tendon of lower back, initial encounter: Secondary | ICD-10-CM

## 2019-10-25 LAB — POCT URINALYSIS DIPSTICK, ED / UC
Bilirubin Urine: NEGATIVE
Glucose, UA: NEGATIVE mg/dL
Hgb urine dipstick: NEGATIVE
Ketones, ur: NEGATIVE mg/dL
Leukocytes,Ua: NEGATIVE
Nitrite: NEGATIVE
Protein, ur: NEGATIVE mg/dL
Specific Gravity, Urine: 1.025 (ref 1.005–1.030)
Urobilinogen, UA: 1 mg/dL (ref 0.0–1.0)
pH: 5.5 (ref 5.0–8.0)

## 2019-10-25 LAB — CBG MONITORING, ED: Glucose-Capillary: 84 mg/dL (ref 70–99)

## 2019-10-25 MED ORDER — MELOXICAM 7.5 MG PO TABS
7.5000 mg | ORAL_TABLET | Freq: Every day | ORAL | 0 refills | Status: DC
Start: 2019-10-25 — End: 2020-06-16

## 2019-10-25 NOTE — ED Triage Notes (Signed)
C/O polyuria x approx 1 wk; 3 days ago started with left low back/flank pain.  Denies dysuria or fevers.

## 2019-10-25 NOTE — Discharge Instructions (Signed)
Your urine is completely normal today which is reassuring. With your symptoms I will send it to be cultured to confirm no infection present.  We will notify of you any positive findings or if any changes to treatment are needed. If normal or otherwise without concern to your results, we will not call you. Please log on to your MyChart to review your results if interested in so.   Light and regular activity as tolerated.  See exercises provided for your back.   Heat application while active can help with muscle spasms.  Sleep with pillow under your knees.   Meloxicam daily. Don't take additional ibuprofen for aleve. Take with food.  If symptoms worsen or do not improve in the next week to return to be seen or to follow up with your PCP.

## 2019-10-25 NOTE — ED Provider Notes (Signed)
[facility]    CSN: [id number] Arrival date & time: 10/25/19  1305      History   Chief Complaint Chief Complaint  Patient presents with   Back Pain   Polyuria    HPI Robin Meadows is a 44 y.o. female.   [name] presents with complaints of urinary frequency for the past week. Back pain started 3 days ago, worsening. Has had a UTI in the past with the same symptoms. No pain with urination. Will urinate small amounts frequently. No blood in urine. No vaginal symptoms. No abdominal pain. Low back, radiates to the sides, L>R. Bending worsens pain. No trigger or injury to back. Took ibuprofen, which didn't seem to help.    ROS per HPI, negative if not otherwise mentioned.      Past Medical History:  Diagnosis Date   Abnormal uterine bleeding    endometrial polyp   Anemia    Ovarian cyst    left   Vertigo     Patient Active Problem List   Diagnosis Date Noted   Status post laparoscopic hysterectomy 07/14/2019    Past Surgical History:  Procedure Laterality Date   ABDOMINAL HYSTERECTOMY     CYSTOSCOPY N/A 07/14/2019   Procedure: CYSTOSCOPY;  Surgeon: [name], MD;  Location: [facility];  Service: Gynecology;  Laterality: N/A;   TOTAL LAPAROSCOPIC HYSTERECTOMY WITH SALPINGECTOMY Bilateral 07/14/2019   Procedure: TOTAL LAPAROSCOPIC HYSTERECTOMY WITH BILATERAL SALPINGECTOMY W/ VAGINAL REMOVAL OF ENDOMETRIAL POLYP AND SUTURE OF VAGINAL LACERATION;  Surgeon: [name], MD;  Location: [facility];  Service: Gynecology;  Laterality: Bilateral;   WISDOM TOOTH EXTRACTION      OB History    Gravida  3   Para  1   Term  1   Preterm      AB  2   Living  1     SAB  2   TAB      Ectopic      Multiple      Live Births               Home Medications    Prior to Admission medications   Medication Sig Start Date End Date Taking? Authorizing  Provider  clobetasol (TEMOVATE) 0.05 % external solution SMARTSIG:1 Milliliter(s) Topical Every Night 09/10/19   [provider]  meloxicam (MOBIC) 7.5 MG tablet Take 1 tablet (7.5 mg total) by mouth daily. 10/25/19   Augusto Gamble B, NP  triamcinolone cream (KENALOG) 0.1 % Apply topically 2 (two) times daily as needed. 09/10/19   [provider]    Family History Family History  Problem Relation Age of Onset   Cancer Mother        ?cervical ca--dec age 31   Cancer Father        dec with colon ca age 52   Hypertension Maternal Grandfather    Cancer Maternal Grandfather        dec Lung cancer   Diabetes Maternal Grandmother     Social History Social History   Tobacco Use   Smoking status: Never Smoker   Smokeless tobacco: Never Used  Scientific laboratory technician Use: Never used  Substance Use Topics   Alcohol use: Never   Drug use: Never     Allergies   Patient has no known allergies.   Review of Systems Review of Systems   Physical Exam Triage Vital  Signs ED Triage Vitals [10/25/19 1350]  Enc Vitals Group     BP 121/89     Pulse Rate 84     Resp (!) 22     Temp 98.6 F (37 C)     Temp Source Oral     SpO2 99 %     Weight      Height      Head Circumference      Peak Flow      Pain Score 8     Pain Loc      Pain Edu?      Excl. in [place]?    No data found.  Updated Vital Signs BP 121/89    Pulse 84    Temp 98.6 F (37 C) (Oral)    Resp (!) 22    LMP 06/22/2019    SpO2 99%   Visual Acuity Right Eye Distance:   Left Eye Distance:   Bilateral Distance:    Right Eye Near:   Left Eye Near:    Bilateral Near:     Physical Exam Constitutional:      General: Robin Meadows is not in acute distress.    Appearance: Robin Meadows is well-developed. Robin Meadows is obese.  Cardiovascular:     Rate and Rhythm: Normal rate.  Pulmonary:     Effort: Pulmonary effort is normal.  Abdominal:     Tenderness: There is no abdominal tenderness. There is no right CVA  tenderness or left CVA tenderness.  Musculoskeletal:     Lumbar back: Tenderness present. No bony tenderness. Negative right straight leg raise test and negative left straight leg raise test.     Comments: strength equal bilaterally; gross sensation intact to lower extremities   Skin:    General: Skin is warm and dry.  Neurological:     Mental Status: Robin Meadows is alert and oriented to person, place, and time.      UC Treatments / Results  Labs (all labs ordered are listed, but only abnormal results are displayed) Labs Reviewed  URINE CULTURE  POCT URINALYSIS DIPSTICK, ED / UC  CBG MONITORING, ED    EKG   Radiology No results found.  Procedures Procedures (including critical care time)  Medications Ordered in UC Medications - No data to display  Initial Impression / Assessment and Plan / UC Course  I have reviewed the triage vital signs and the nursing notes.  Pertinent labs & imaging results that were available during my care of the patient were reviewed by me and considered in my medical decision making (see chart for details).     UA without acute findings today. Culture pending. Blood sugar normal and no glucose to urine. Back pain management discussed, consistent with msk back pain. Return precautions provided. Patient verbalized understanding and agreeable to plan.  Ambulatory out of clinic without difficulty.    Final Clinical Impressions(s) / UC Diagnoses   Final diagnoses:  Urinary frequency  Strain of lumbar region, initial encounter     Discharge Instructions     Your urine is completely normal today which is reassuring. With your symptoms I will send it to be cultured to confirm no infection present.  We will notify of you any positive findings or if any changes to treatment are needed. If normal or otherwise without concern to your results, we will not call you. Please log on to your MyChart to review your results if interested in so.   Light and regular  activity as tolerated.  See exercises provided for your back.   Heat application while active can help with muscle spasms.  Sleep with pillow under your knees.   Meloxicam daily. Don't take additional ibuprofen for aleve. Take with food.  If symptoms worsen or do not improve in the next week to return to be seen or to follow up with your PCP.      ED Prescriptions    Medication Sig Dispense Auth. Provider   meloxicam (MOBIC) 7.5 MG tablet Take 1 tablet (7.5 mg total) by mouth daily. 30 tablet Zigmund Gottron, NP     PDMP not reviewed this encounter.   Zigmund Gottron, NP 10/25/19 2229

## 2019-10-26 LAB — URINE CULTURE: Culture: NO GROWTH

## 2019-12-10 DIAGNOSIS — L7211 Pilar cyst: Secondary | ICD-10-CM | POA: Diagnosis not present

## 2019-12-10 DIAGNOSIS — L308 Other specified dermatitis: Secondary | ICD-10-CM | POA: Diagnosis not present

## 2020-02-04 DIAGNOSIS — L7211 Pilar cyst: Secondary | ICD-10-CM | POA: Diagnosis not present

## 2020-02-15 DIAGNOSIS — Z4802 Encounter for removal of sutures: Secondary | ICD-10-CM | POA: Diagnosis not present

## 2020-06-16 ENCOUNTER — Ambulatory Visit (INDEPENDENT_AMBULATORY_CARE_PROVIDER_SITE_OTHER): Payer: 59 | Admitting: Obstetrics and Gynecology

## 2020-06-16 ENCOUNTER — Encounter: Payer: Self-pay | Admitting: Obstetrics and Gynecology

## 2020-06-16 ENCOUNTER — Other Ambulatory Visit: Payer: Self-pay

## 2020-06-16 VITALS — BP 120/74 | HR 104 | Ht 69.0 in | Wt 309.0 lb

## 2020-06-16 DIAGNOSIS — Z800 Family history of malignant neoplasm of digestive organs: Secondary | ICD-10-CM | POA: Diagnosis not present

## 2020-06-16 DIAGNOSIS — Z01419 Encounter for gynecological examination (general) (routine) without abnormal findings: Secondary | ICD-10-CM | POA: Diagnosis not present

## 2020-06-16 DIAGNOSIS — Z1211 Encounter for screening for malignant neoplasm of colon: Secondary | ICD-10-CM | POA: Diagnosis not present

## 2020-06-16 DIAGNOSIS — N898 Other specified noninflammatory disorders of vagina: Secondary | ICD-10-CM | POA: Diagnosis not present

## 2020-06-16 DIAGNOSIS — Z23 Encounter for immunization: Secondary | ICD-10-CM

## 2020-06-16 LAB — COMPREHENSIVE METABOLIC PANEL
AG Ratio: 1.4 (calc) (ref 1.0–2.5)
ALT: 7 U/L (ref 6–29)
AST: 15 U/L (ref 10–30)
Albumin: 4.2 g/dL (ref 3.6–5.1)
Alkaline phosphatase (APISO): 83 U/L (ref 31–125)
BUN: 17 mg/dL (ref 7–25)
CO2: 27 mmol/L (ref 20–32)
Calcium: 9.5 mg/dL (ref 8.6–10.2)
Chloride: 103 mmol/L (ref 98–110)
Creat: 0.85 mg/dL (ref 0.50–1.10)
Globulin: 3 g/dL (calc) (ref 1.9–3.7)
Glucose, Bld: 98 mg/dL (ref 65–99)
Potassium: 4.1 mmol/L (ref 3.5–5.3)
Sodium: 140 mmol/L (ref 135–146)
Total Bilirubin: 0.3 mg/dL (ref 0.2–1.2)
Total Protein: 7.2 g/dL (ref 6.1–8.1)

## 2020-06-16 LAB — CBC
HCT: 40.2 % (ref 35.0–45.0)
Hemoglobin: 13.1 g/dL (ref 11.7–15.5)
MCH: 27.8 pg (ref 27.0–33.0)
MCHC: 32.6 g/dL (ref 32.0–36.0)
MCV: 85.4 fL (ref 80.0–100.0)
MPV: 10.4 fL (ref 7.5–12.5)
Platelets: 276 10*3/uL (ref 140–400)
RBC: 4.71 10*6/uL (ref 3.80–5.10)
RDW: 14 % (ref 11.0–15.0)
WBC: 8.7 10*3/uL (ref 3.8–10.8)

## 2020-06-16 LAB — LIPID PANEL
Cholesterol: 212 mg/dL — ABNORMAL HIGH (ref <200)
HDL: 59 mg/dL (ref >=50)
LDL Cholesterol (Calc): 117 mg/dL (calc) — ABNORMAL HIGH
Non-HDL Cholesterol (Calc): 153 mg/dL (calc) — ABNORMAL HIGH (ref <130)
Total CHOL/HDL Ratio: 3.6 (calc) (ref <5.0)
Triglycerides: 237 mg/dL — ABNORMAL HIGH (ref <150)

## 2020-06-16 MED ORDER — METRONIDAZOLE 500 MG PO TABS: 500.0000 mg | ORAL_TABLET | Freq: Two times a day (BID) | ORAL | 0 refills | Status: DC

## 2020-06-16 MED ORDER — NYSTATIN 100000 UNIT/GM EX POWD: 1.0000 "application " | Freq: Three times a day (TID) | CUTANEOUS | 2 refills | Status: DC

## 2020-06-16 NOTE — Patient Instructions (Signed)

## 2020-06-16 NOTE — Progress Notes (Signed)
45 y.o. G11P1021 Married Caucasian female here for annual exam.    Doing well after hysterectomy.   Has problems with heat rash in the summer in the fold of her thighs.  Wearing a panty shield often for moisture.  Some vaginal irritation.   Father dx with colon cancer possibly in his 40s.   Received her Covid vaccine.   PCP: Lucky Cowboy, FNP    Patient's last menstrual period was 06/22/2019.           Sexually active: Yes.    The current method of family planning is status post hysterectomy.    Exercising: No.  The patient does not participate in regular exercise at present. Smoker:  no  Health Maintenance: Pap: 05-19-19 Neg:Neg HR HPV, 2019 normal per patient History of abnormal Pap:  no MMG:  06-04-19 3D/Neg/BiRads1--knows she needs to schedule Colonoscopy:  n/a BMD:   n/a  Result  n/a TDaP: 12-13 years ago--would like today Gardasil:   no HIV:Neg in Preg Hep C:no Screening Labs:  PCP.   reports that she has never smoked. She has never used smokeless tobacco. She reports that she does not drink alcohol and does not use drugs.  Past Medical History:  Diagnosis Date  . Abnormal uterine bleeding    endometrial polyp  . Anemia   . Ovarian cyst    left  . Vertigo     Past Surgical History:  Procedure Laterality Date  . ABDOMINAL HYSTERECTOMY    . CYSTOSCOPY N/A 07/14/2019   Procedure: CYSTOSCOPY;  Surgeon: [name], MD;  Location: [facility];  Service: Gynecology;  Laterality: N/A;  . TOTAL LAPAROSCOPIC HYSTERECTOMY WITH SALPINGECTOMY Bilateral 07/14/2019   Procedure: TOTAL LAPAROSCOPIC HYSTERECTOMY WITH BILATERAL SALPINGECTOMY W/ VAGINAL REMOVAL OF ENDOMETRIAL POLYP AND SUTURE OF VAGINAL LACERATION;  Surgeon: [name], MD;  Location: [facility];  Service: Gynecology;  Laterality: Bilateral;  . WISDOM TOOTH EXTRACTION      Current Outpatient Medications  Medication Sig Dispense Refill  .  triamcinolone cream (KENALOG) 0.1 % Apply topically 2 (two) times daily as needed.     No current facility-administered medications for this visit.    Family History  Problem Relation Age of Onset  . Cancer Mother        ?cervical ca--dec age 69  . Cancer Father        dec with colon ca age 48  . Hypertension Maternal Grandfather   . Cancer Maternal Grandfather        dec Lung cancer  . Diabetes Maternal Grandmother     Review of Systems  All other systems reviewed and are negative.   Exam:   BP 120/74 (Cuff Size: Large)   Pulse (!) 104   Ht 5\' 9"  (1.753 m)   Wt (!) 309 lb (140.2 kg)   LMP 06/22/2019   SpO2 98%   BMI 45.63 kg/m     General appearance: alert, cooperative and appears stated age Head: normocephalic, without obvious abnormality, atraumatic Neck: no adenopathy, supple, symmetrical, trachea midline and thyroid normal to inspection and palpation Lungs: clear to auscultation bilaterally Breasts: normal appearance, no masses or tenderness, No nipple retraction or dimpling, No nipple discharge or bleeding, No axillary adenopathy Heart: regular rate and rhythm Abdomen: soft, non-tender; no masses, no organomegaly Extremities: extremities normal, atraumatic, no cyanosis or edema Skin: skin color, texture, turgor normal. No rashes or lesions Lymph nodes: cervical, supraclavicular, and axillary nodes normal. Neurologic:  grossly normal  Pelvic: External genitalia:  Erythema of the vulva medially - labia minora bilaterally.               No abnormal inguinal nodes palpated.              Urethra:  normal appearing urethra with no masses, tenderness or lesions              Bartholins and Skenes: normal                 Vagina: normal appearing vagina with normal color and discharge, no lesions              Cervix: absent              Pap taken: No. Bimanual Exam:  Uterus:  absent              Adnexa: no mass, fullness, tenderness              Rectal exam:   declines. Chaperone was present for exam.  Assessment:   Well woman visit with normal exam. Status post total laparoscopic hysterectomy with bilateral salpingectomy,cystoscopy, vaginal removal of endometrial polyp. FH colon cancer.  Vaginal irritation.   Plan: Mammogram screening discussed. Self breast awareness reviewed. Pap and HR HPV as above. Guidelines for Calcium, Vitamin D, regular exercise program including cardiovascular and weight bearing exercise. Nystatin powder.  Wet prep:  Positive clue cells, neg yeast, neg trichomonas. Odor positive. Flagyl 500 mg po bid x 7 days. Routine labs.  TDap. Referral for colonoscopy.  Follow up annually and prn.

## 2020-06-17 LAB — WET PREP FOR TRICH, YEAST, CLUE

## 2020-06-20 ENCOUNTER — Other Ambulatory Visit: Payer: Self-pay | Admitting: Obstetrics and Gynecology

## 2020-06-20 DIAGNOSIS — Z1231 Encounter for screening mammogram for malignant neoplasm of breast: Secondary | ICD-10-CM

## 2020-06-23 ENCOUNTER — Other Ambulatory Visit: Payer: Self-pay

## 2020-06-23 ENCOUNTER — Ambulatory Visit
Admission: RE | Admit: 2020-06-23 | Discharge: 2020-06-23 | Disposition: A | Discharge status: Home / Self Care | Payer: BC Managed Care – PPO | Source: Ambulatory Visit

## 2020-06-23 DIAGNOSIS — Z1231 Encounter for screening mammogram for malignant neoplasm of breast: Secondary | ICD-10-CM

## 2020-07-13 ENCOUNTER — Encounter: Payer: Self-pay | Admitting: Gastroenterology

## 2020-09-08 ENCOUNTER — Ambulatory Visit (INDEPENDENT_AMBULATORY_CARE_PROVIDER_SITE_OTHER): Payer: 59 | Admitting: Gastroenterology

## 2020-09-08 ENCOUNTER — Encounter: Payer: Self-pay | Admitting: Gastroenterology

## 2020-09-08 VITALS — BP 116/76 | HR 98 | Ht 70.0 in | Wt 304.6 lb

## 2020-09-08 DIAGNOSIS — K5900 Constipation, unspecified: Secondary | ICD-10-CM

## 2020-09-08 DIAGNOSIS — Z800 Family history of malignant neoplasm of digestive organs: Secondary | ICD-10-CM | POA: Diagnosis not present

## 2020-09-08 MED ORDER — LINACLOTIDE 72 MCG PO CAPS: 72.0000 ug | ORAL_CAPSULE | Freq: Every day | ORAL | 2 refills | Status: DC

## 2020-09-08 NOTE — Patient Instructions (Addendum)
It was my pleasure to provide care to you you today. Based on our discussion, I am providing you with my recommendations below:  RECOMMENDATION(S):   We have sent the following medication(s) to your pharmacy:  Linzess - please take 65mcg by mouth daily  NOTE: If your medication(s) requires a PRIOR AUTHORIZATION, we will receive notification from your pharmacy. Once received, the process to submit for approval may take up to 7-10 business days. You will be contacted about any denials we have received from your insurance company as well as alternatives recommended by your provider.  COLONOSCOPY:   You have been scheduled for a colonoscopy. Please follow written instructions given to you at your visit today.   PREP:   Please pick up your prep supplies at the pharmacy within the next 1-3 days.  INHALERS:   If you use inhalers (even only as needed), please bring them with you on the day of your procedure.  COLONOSCOPY TIPS:  To reduce nausea and dehydration, stay well hydrated for 3-4 days prior to the exam.  To prevent skin/hemorrhoid irritation - prior to wiping, put A&Dointment or vaseline on the toilet paper. Keep a towel or pad on the bed.  BEFORE STARTING YOUR PREP, drink  64oz of clear liquids in the morning. This will help to flush the colon and will ensure you are well hydrated!!!!  NOTE - This is in addition to the fluids required for to complete your prep. Use of a flavored hard candy, such as grape Anise Salvo, can counteract some of the flavor of the prep and may prevent some nausea.   FOLLOW UP:  After your procedure, you will receive a call from my office staff regarding my recommendation for follow up.  BMI:  If you are age 103 or younger, your body mass index should be between 19-25. Your There is no height or weight on file to calculate BMI. If this is out of the aformentioned range listed, please consider follow up with your Primary Care Provider.   MY  CHART:  The [facility] providers would like to encourage you to use [facility] to communicate with providers for non-urgent requests or questions.  Due to long hold times on the telephone, sending your provider a message by [facility] may be a faster and more efficient way to get a response.  Please allow 48 business hours for a response.  Please remember that this is for non-urgent requests.   Thank you for trusting me with your gastrointestinal care!    Thornton Park, MD, MPH

## 2020-09-08 NOTE — Progress Notes (Signed)
Referring Provider: Beverley Fiedler, * Primary Care Physician:  Beverley Fiedler, FNP  Reason for Consultation: Family history of colon cancer   IMPRESSION:  Family history of colon cancer (father age 45) Single episode of rectal bleeding and mucous in the stool Rare constipation with ? Pelvic floor dysnnergia    - she is interested in prescription therapy    - discussed trial of Linzess, tapering to meet her needs Rare heartburn   PLAN: - High fiber diet, drink at least 64 ounces of water daily, consider fiber supplements - Colonoscopy given the family history of colon cancer - Linzess 72 mcg daily  Please see the "Patient Instructions" section for addition details about the plan.  HPI: Robin Meadows is a 45 y.o. female referred by Dr. Quincy Simmonds for a family history of colon cancer.  Father had colon cancer likely diagnosed at age 65. No other known family history of colon cancer or polyps. No family history of pancreatic cancer or gastric/stomach cancer. Mother with uterine cancer.   She reports occasional constipation that she treats with Dulcolax, not needing it more than once monthly. One episode of blood and mucous with associated constipation over the last few months. Intermittent straining and sense of incomplete evacuation. Rare heartburn. GI ROS is otherwise negative.    Past Medical History:  Diagnosis Date   Abnormal uterine bleeding    endometrial polyp   Anemia    Ovarian cyst    left   Vertigo     Past Surgical History:  Procedure Laterality Date   ABDOMINAL HYSTERECTOMY     CYSTOSCOPY N/A 07/14/2019   Procedure: CYSTOSCOPY;  Surgeon: [name], MD;  [facility]: [facility];  Service: Gynecology;  Laterality: N/A;   TOTAL LAPAROSCOPIC HYSTERECTOMY WITH SALPINGECTOMY Bilateral 07/14/2019   Procedure: TOTAL LAPAROSCOPIC HYSTERECTOMY WITH BILATERAL SALPINGECTOMY W/ VAGINAL REMOVAL OF ENDOMETRIAL POLYP AND SUTURE  OF VAGINAL LACERATION;  Surgeon: [name], MD;  Location: [facility];  Service: Gynecology;  Laterality: Bilateral;   WISDOM TOOTH EXTRACTION      Current Outpatient Medications  Medication Sig Dispense Refill   linaclotide (LINZESS) 72 MCG capsule Take 1 capsule (72 mcg total) by mouth daily before breakfast. 30 capsule 2   No current facility-administered medications for this visit.    Allergies as of 09/08/2020   (No Known Allergies)    Family History  Problem Relation Age of Onset   Cancer Mother        ?cervical ca--dec age 17   Colon cancer Father        at age of 45   Diabetes Maternal Grandmother    Hypertension Maternal Grandfather    Cancer Maternal Grandfather        dec Lung cancer   Liver cancer Maternal Uncle     Social History   Socioeconomic History   Marital status: Married    Spouse name: Not on file   Number of children: Not on file   Years of education: Not on file   Highest education level: Not on file  Occupational History   Not on file  Tobacco Use   Smoking status: Never   Smokeless tobacco: Never  Vaping Use   Vaping Use: Never used  Substance and Sexual Activity   Alcohol use: Never   Drug use: Never   Sexual activity: Yes    Birth control/protection: Surgical    Comment: Hysterectomy  Other Topics  Concern   Not on file  Social History Narrative   Not on file   Social Determinants of Health   Financial Resource Strain: Not on file  Food Insecurity: Not on file  Transportation Needs: Not on file  Physical Activity: Not on file  Stress: Not on file  Social Connections: Not on file  Intimate Partner Violence: Not on file    Review of Systems: 12 system ROS is negative except as noted above.   Physical Exam: General:   Alert,  well-nourished, pleasant and cooperative in NAD Head:  Normocephalic and atraumatic. Eyes:  Sclera clear, no icterus.   Conjunctiva pink. Ears:  Normal auditory  acuity. Nose:  No deformity, discharge,  or lesions. Mouth:  No deformity or lesions.   Neck:  Supple; no masses or thyromegaly. Lungs:  Clear throughout to auscultation.   No wheezes. Heart:  Regular rate and rhythm; no murmurs. Abdomen:  Soft,nontender, nondistended, normal bowel sounds, no rebound or guarding. No hepatosplenomegaly.   Rectal:  Deferred  Msk:  Symmetrical. No boney deformities LAD: No inguinal or umbilical LAD Extremities:  No clubbing or edema. Neurologic:  Alert and  oriented x4;  grossly nonfocal Skin:  Intact without significant lesions or rashes. Psych:  Alert and cooperative. Normal mood and affect.     Robin Meadows L. Tarri Glenn, MD, MPH 09/08/2020, 10:58 AM

## 2020-10-17 ENCOUNTER — Ambulatory Visit (AMBULATORY_SURGERY_CENTER): Payer: 59 | Admitting: Gastroenterology

## 2020-10-17 ENCOUNTER — Encounter: Payer: Self-pay | Admitting: Gastroenterology

## 2020-10-17 ENCOUNTER — Other Ambulatory Visit: Payer: Self-pay

## 2020-10-17 VITALS — BP 123/82 | HR 70 | Temp 98.0°F | Resp 12 | Ht 70.0 in | Wt 304.0 lb

## 2020-10-17 DIAGNOSIS — D125 Benign neoplasm of sigmoid colon: Secondary | ICD-10-CM

## 2020-10-17 DIAGNOSIS — D128 Benign neoplasm of rectum: Secondary | ICD-10-CM | POA: Diagnosis not present

## 2020-10-17 DIAGNOSIS — D122 Benign neoplasm of ascending colon: Secondary | ICD-10-CM

## 2020-10-17 DIAGNOSIS — Z800 Family history of malignant neoplasm of digestive organs: Secondary | ICD-10-CM | POA: Diagnosis not present

## 2020-10-17 DIAGNOSIS — K625 Hemorrhage of anus and rectum: Secondary | ICD-10-CM | POA: Diagnosis not present

## 2020-10-17 DIAGNOSIS — K5900 Constipation, unspecified: Secondary | ICD-10-CM

## 2020-10-17 MED ORDER — SODIUM CHLORIDE 0.9 % IV SOLN: 500.0000 mL | INTRAVENOUS | Status: DC

## 2020-10-17 NOTE — Progress Notes (Signed)
Pt's states no medical or surgical changes since previsit or office visit. 

## 2020-10-17 NOTE — Op Note (Addendum)
[place] Patient Name: Robin Meadows Procedure Date: 10/17/2020 3:35 PM MRN: [id number] Endoscopist: Thornton Park MD, MD Age: 45 Referring MD:  Date of Birth: May 13, 1975 Gender: Female Account #: [id number] Procedure:                Colonoscopy Indications:              Screening in patient at increased risk: Family                            history of 1st-degree relative with colorectal                            cancer before age 11 years                           Family history of colon cancer (father age 68)                           Single episode of rectal bleeding and mucous in the                            stool Medicines:                Monitored Anesthesia Care Procedure:                Pre-Anesthesia Assessment:                           - Prior to the procedure, a History and Physical                            was performed, and patient medications and                            allergies were reviewed. The patient's tolerance of                            previous anesthesia was also reviewed. The risks                            and benefits of the procedure and the sedation                            options and risks were discussed with the patient.                            All questions were answered, and informed consent                            was obtained. Prior Anticoagulants: The patient has                            taken no previous anticoagulant or antiplatelet  agents. ASA Grade Assessment: II - A patient with                            mild systemic disease. After reviewing the risks                            and benefits, the patient was deemed in                            satisfactory condition to undergo the procedure.                           After obtaining informed consent, the colonoscope                            was passed under direct vision. Throughout the                             procedure, the patient's blood pressure, pulse, and                            oxygen saturations were monitored continuously. The                            Colonoscope was introduced through the anus and                            advanced to the 4 cm into the ileum. The                            colonoscopy was technically difficult and complex                            due to a redundant colon, significant looping and a                            tortuous colon. Successful completion of the                            procedure was aided by changing the patient's                            position, withdrawing and reinserting the scope,                            withdrawing the scope and replacing with the                            pediatric colonoscope and applying abdominal                            pressure. The patient tolerated the procedure well.  The quality of the bowel preparation was good. The                            terminal ileum, ileocecal valve, appendiceal                            orifice, and rectum were photographed. Scope In: 3:52:08 PM Scope Out: 4:30:40 PM Scope Withdrawal Time: 0 hours 28 minutes 52 seconds  Total Procedure Duration: 0 hours 38 minutes 32 seconds  Findings:                 The perianal and digital rectal examinations were                            normal.                           A 4 mm polyp was found in the ascending colon. The                            polyp was sessile. The polyp was removed with a                            cold snare. Resection and retrieval were complete.                            Estimated blood loss was minimal.                           A 22 mm long, narrow-based pedunculated polyp was                            found in the recto-sigmoid colon, located 22 cm                            from the anal verge. Failed polyloop deployment.                            The polyp was removed  with a cold snare. Resection                            and retrieval were complete. However, bleeding                            occurred after the polypectomy. The base of the                            residual stalk was injected with 1 mL of a 1:10,000                            solution of epinephrine for hemostasis of bleeding                            caused by  the procedure. To stop active bleeding,                            two hemostatic clips were successfully placed.                            There was no bleeding at the end of the procedure.                            Area was then tattooed with an injection of 2 mL of                            Spot (carbon black).                           Several diminutive polyps most consistent with                            hyperplastic polyps were found in the rectum. These                            were not removed. Complications:            Hemorrhage Estimated Blood Loss:     Estimated blood loss: 10 mL requiring treatment                            with epinephrine. Impression:               - One 4 mm polyp in the ascending colon, removed                            with a cold snare. Resected and retrieved.                           - One 22 mm polyp at the recto-sigmoid colon,                            removed with a cold snare. Post-polpectomy bleeding                            treated with epinephrine injection and Endoclip x                            2. This site is high risk for rebleeding.                           - Several diminutive polyps in the rectum. No                            removed. Recommendation:           - Patient has a contact number available for  emergencies. The signs and symptoms of potential                            delayed complications were discussed with the                            patient. Return to normal activities tomorrow.                             Written discharge instructions were provided to the                            patient.                           - Resume previous diet.                           - Continue present medications.                           - Await pathology results.                           - Repeat colonoscopy date to be determined after                            pending pathology results are reviewed for                            surveillance.                           - Emerging evidence supports eating a diet of                            fruits, vegetables, grains, calcium, and yogurt                            while reducing red meat and alcohol may reduce the                            risk of colon cancer.                           - Thank you for allowing me to be involved in your                            colon cancer prevention. Thornton Park MD, MD 10/17/2020 4:45:34 PM This report has been signed electronically.

## 2020-10-17 NOTE — Progress Notes (Signed)
Report given to PACU, vss 

## 2020-10-17 NOTE — Patient Instructions (Signed)
You will have some bleeding when you wipe.  If you see a lot of blood in the bowl, and clots form... call us asap.  Read all of the handouts given to you by your recovery room .  Keep youur clip card in your wallet. No MRI for 1 month.  YOU HAD AN ENDOSCOPIC PROCEDURE TODAY AT [facility]:   Refer to the procedure report that was given to you for any specific questions about what was found during the examination.  If the procedure report does not answer your questions, please call your gastroenterologist to clarify.  If you requested that your care partner not be given the details of your procedure findings, then the procedure report has been included in a sealed envelope for you to review at your convenience later.  YOU SHOULD EXPECT: Some feelings of bloating in the abdomen. Passage of more gas than usual.  Walking can help get rid of the air that was put into your GI tract during the procedure and reduce the bloating. If you had a lower endoscopy (such as a colonoscopy or flexible sigmoidoscopy) you may notice spotting of blood in your stool or on the toilet paper. If you underwent a bowel prep for your procedure, you may not have a normal bowel movement for a few days.  Please Note:  You might notice some irritation and congestion in your nose or some drainage.  This is from the oxygen used during your procedure.  There is no need for concern and it should clear up in a day or so.  SYMPTOMS TO REPORT IMMEDIATELY:  Following lower endoscopy (colonoscopy or flexible sigmoidoscopy):  Excessive amounts of blood in the stool  Significant tenderness or worsening of abdominal pains  Swelling of the abdomen that is new, acute  Fever of 100F or higher   For urgent or emergent issues, a gastroenterologist can be reached at any hour by calling [phone]. Do not use MyChart messaging for urgent concerns.    DIET:  We do recommend a small meal at first, but then you may proceed to  your regular diet.  Drink plenty of fluids but you should avoid alcoholic beverages for 24 hours. Try to eat a high fiber diet, and drink plenty of water.  Cut back on red meat.  ACTIVITY:  You should plan to take it easy for the rest of today and you should NOT DRIVE or use heavy machinery until tomorrow (because of the sedation medicines used during the test).    FOLLOW UP: Our staff will call the number listed on your records 48-72 hours following your procedure to check on you and address any questions or concerns that you may have regarding the information given to you following your procedure. If we do not reach you, we will leave a message.  We will attempt to reach you two times.  During this call, we will ask if you have developed any symptoms of COVID 19. If you develop any symptoms (ie: fever, flu-like symptoms, shortness of breath, cough etc.) before then, please call [phone].  If you test positive for Covid 19 in the 2 weeks post procedure, please call and report this information to [place].    If any biopsies were taken you will be contacted by phone or by letter within the next 1-3 weeks.  Please call us at [phone] if you have not heard about the biopsies in 3 weeks.    SIGNATURES/CONFIDENTIALITY: You and/or your care  partner have signed paperwork which will be entered into your electronic medical record.  These signatures attest to the fact that that the information above on your After Visit Summary has been reviewed and is understood.  Full responsibility of the confidentiality of this discharge information lies with you and/or your care-partner.  

## 2020-10-17 NOTE — Progress Notes (Signed)
Called to room to assist during endoscopic procedure.  Patient ID and intended procedure confirmed with present staff. Received instructions for my participation in the procedure from the performing physician.  

## 2020-10-19 ENCOUNTER — Telehealth: Payer: Self-pay | Admitting: *Deleted

## 2020-10-19 NOTE — Telephone Encounter (Signed)
  Follow up Call-  Call back number 10/17/2020  Post procedure Call Back phone  # [phone]  Permission to leave phone message Yes  Some recent data might be hidden     No answer at 2nd attempt follow up phone call.  Left message on voicemail.

## 2020-10-19 NOTE — Telephone Encounter (Signed)
First f/u call attempt.  LVM

## 2020-10-25 ENCOUNTER — Encounter: Payer: Self-pay | Admitting: Gastroenterology

## 2020-10-26 ENCOUNTER — Other Ambulatory Visit: Payer: Self-pay

## 2020-10-26 DIAGNOSIS — Z800 Family history of malignant neoplasm of digestive organs: Secondary | ICD-10-CM

## 2020-10-26 DIAGNOSIS — D126 Benign neoplasm of colon, unspecified: Secondary | ICD-10-CM

## 2020-12-18 ENCOUNTER — Encounter (HOSPITAL_COMMUNITY): Payer: Self-pay

## 2020-12-18 ENCOUNTER — Other Ambulatory Visit: Payer: Self-pay

## 2020-12-18 ENCOUNTER — Ambulatory Visit (HOSPITAL_COMMUNITY)
Admission: RE | Admit: 2020-12-18 | Discharge: 2020-12-18 | Disposition: A | Discharge status: Home / Self Care | Payer: 59 | Source: Ambulatory Visit | Attending: Internal Medicine | Admitting: Internal Medicine

## 2020-12-18 VITALS — BP 128/83 | HR 74 | Temp 98.2°F | Resp 17

## 2020-12-18 DIAGNOSIS — R350 Frequency of micturition: Secondary | ICD-10-CM | POA: Diagnosis present

## 2020-12-18 DIAGNOSIS — R109 Unspecified abdominal pain: Secondary | ICD-10-CM | POA: Insufficient documentation

## 2020-12-18 DIAGNOSIS — R10A0 Flank pain, unspecified side: Secondary | ICD-10-CM

## 2020-12-18 LAB — POCT URINALYSIS DIPSTICK, ED / UC
Bilirubin Urine: NEGATIVE
Glucose, UA: NEGATIVE mg/dL
Hgb urine dipstick: NEGATIVE
Ketones, ur: NEGATIVE mg/dL
Leukocytes,Ua: NEGATIVE
Nitrite: NEGATIVE
Protein, ur: NEGATIVE mg/dL
Specific Gravity, Urine: <=1.005 (ref 1.005–1.030)
Urobilinogen, UA: 0.2 mg/dL (ref 0.0–1.0)
pH: 6 (ref 5.0–8.0)

## 2020-12-18 NOTE — ED Triage Notes (Signed)
Pt presents with bilateral side pain and urinary frequency Xs 4 days.

## 2020-12-18 NOTE — ED Provider Notes (Signed)
[facility]    CSN: [id number] Arrival date & time: 12/18/20  1140      History   Chief Complaint Chief Complaint  Patient presents with   Urinary Frequency    APPT   Flank Pain    HPI Robin Meadows is a 45 y.o. female.   Patient here for evaluation of right flank pain and urinary frequency that has been ongoing since Thursday.  Reports having some left flank pain this morning.  Reports pain radiates to lower abdomen.  Denies any dysuria or urgency.  Denies any vaginal discharge or pain.  Has not taken any OTC medications or treatments.  Does report some constipation.  Denies any trauma, injury, or other precipitating event.  Denies any specific alleviating or aggravating factors.  Denies any fevers, chest pain, shortness of breath, N/V/D, numbness, tingling, weakness, abdominal pain, or headaches.    The history is provided by the patient.  Urinary Frequency  Flank Pain   Past Medical History:  Diagnosis Date   Abnormal uterine bleeding    endometrial polyp   Anemia    Ovarian cyst    left   Vertigo     Patient Active Problem List   Diagnosis Date Noted   Status post laparoscopic hysterectomy 07/14/2019    Past Surgical History:  Procedure Laterality Date   ABDOMINAL HYSTERECTOMY     CYSTOSCOPY N/A 07/14/2019   Procedure: CYSTOSCOPY;  Surgeon: [name], MD;  [facility];  Service: Gynecology;  Laterality: N/A;   TOTAL LAPAROSCOPIC HYSTERECTOMY WITH SALPINGECTOMY Bilateral 07/14/2019   Procedure: TOTAL LAPAROSCOPIC HYSTERECTOMY WITH BILATERAL SALPINGECTOMY W/ VAGINAL REMOVAL OF ENDOMETRIAL POLYP AND SUTURE OF VAGINAL LACERATION;  Surgeon: [name], MD;  Location: [facility];  Service: Gynecology;  Laterality: Bilateral;   WISDOM TOOTH EXTRACTION      OB History     Gravida  3   Para  1   Term  1   Preterm      AB  2   Living  1      SAB  2   IAB       Ectopic      Multiple      Live Births               Home Medications    Prior to Admission medications   Medication Sig Start Date End Date Taking? Authorizing Provider  linaclotide (LINZESS) 72 MCG capsule Take 1 capsule (72 mcg total) by mouth daily before breakfast. Patient not taking: Reported on 10/17/2020 09/08/20   Thornton Park, MD    Family History Family History  Problem Relation Age of Onset   Cancer Mother        ?cervical ca--dec age 49   Colon cancer Father        at age of 80   Diabetes Maternal Grandmother    Hypertension Maternal Grandfather    Cancer Maternal Grandfather        dec Lung cancer   Liver cancer Maternal Uncle     Social History Social History   Tobacco Use   Smoking status: Never   Smokeless tobacco: Never  Vaping Use   Vaping Use: Never used  Substance Use Topics   Alcohol use: Never   Drug use: Never     Allergies   Patient has no known allergies.   Review of Systems Review of Systems  Gastrointestinal:  Positive  for constipation.  Genitourinary:  Positive for flank pain and frequency.  All other systems reviewed and are negative.   Physical Exam Triage Vital Signs ED Triage Vitals [12/18/20 1209]  Enc Vitals Group     BP 128/83     Pulse Rate 74     Resp 17     Temp 98.2 F (36.8 C)     Temp Source Oral     SpO2 98 %     Weight      Height      Head Circumference      Peak Flow      Pain Score 3     Pain Loc      Pain Edu?      Excl. in [place]?    No data found.  Updated Vital Signs BP 128/83 (BP Location: Right Arm)   Pulse 74   Temp 98.2 F (36.8 C) (Oral)   Resp 17   LMP 06/22/2019   SpO2 98%   Visual Acuity Right Eye Distance:   Left Eye Distance:   Bilateral Distance:    Right Eye Near:   Left Eye Near:    Bilateral Near:     Physical Exam Vitals and nursing note reviewed.  Constitutional:      General: She is not in acute distress.    Appearance: Normal appearance. She is not  ill-appearing, toxic-appearing or diaphoretic.  HENT:     Head: Normocephalic and atraumatic.  Eyes:     Conjunctiva/sclera: Conjunctivae normal.  Cardiovascular:     Rate and Rhythm: Normal rate.     Pulses: Normal pulses.     Heart sounds: Normal heart sounds.  Pulmonary:     Effort: Pulmonary effort is normal.     Breath sounds: Normal breath sounds.  Abdominal:     General: Abdomen is flat.     Palpations: Abdomen is soft.     Tenderness: There is no right CVA tenderness or left CVA tenderness.  Musculoskeletal:        General: Normal range of motion.     Cervical back: Normal range of motion.  Skin:    General: Skin is warm and dry.  Neurological:     General: No focal deficit present.     Mental Status: She is alert and oriented to person, place, and time.  Psychiatric:        Mood and Affect: Mood normal.     UC Treatments / Results  Labs (all labs ordered are listed, but only abnormal results are displayed) Labs Reviewed  URINE CULTURE  POCT URINALYSIS DIPSTICK, ED / UC    EKG   Radiology No results found.  Procedures Procedures (including critical care time)  Medications Ordered in UC Medications - No data to display  Initial Impression / Assessment and Plan / UC Course  I have reviewed the triage vital signs and the nursing notes.  Pertinent labs & imaging results that were available during my care of the patient were reviewed by me and considered in my medical decision making (see chart for details).    Assessment negative for red flags or concerns.  Urinalysis within normal limits with no signs of infection.  Urine culture pending due to flank pain and frequency.  Possibly constipated.  Encourage fluids and increase fiber in diet.  May take MiraLAX daily as needed.  Tylenol and/or ibuprofen as needed.  Follow-up for reevaluation. Final Clinical Impressions(s) / UC Diagnoses   Final diagnoses:  Flank  pain  Urinary frequency     Discharge  Instructions      You can take MiraLAX daily until you have a good bowel movement. Try to increase the amount of fiber in your diet.  You can take Tylenol and/or Ibuprofen as needed for pain relief and fever reduction.   Make sure you are drinking plenty of fluids, especially water.    Return or go to the Emergency Department if symptoms worsen or do not improve in the next few days.      ED Prescriptions   None    PDMP not reviewed this encounter.   Pearson Forster, NP 12/18/20 1243

## 2020-12-18 NOTE — Discharge Instructions (Addendum)
You can take MiraLAX daily until you have a good bowel movement. Try to increase the amount of fiber in your diet.  You can take Tylenol and/or Ibuprofen as needed for pain relief and fever reduction.   Make sure you are drinking plenty of fluids, especially water.    Return or go to the Emergency Department if symptoms worsen or do not improve in the next few days.

## 2020-12-19 LAB — URINE CULTURE: Culture: NO GROWTH

## 2021-04-14 ENCOUNTER — Other Ambulatory Visit: Payer: Self-pay

## 2021-04-14 ENCOUNTER — Ambulatory Visit
Admission: RE | Admit: 2021-04-14 | Discharge: 2021-04-14 | Disposition: A | Discharge status: Home / Self Care | Payer: Medicaid Other | Source: Ambulatory Visit | Attending: Emergency Medicine | Admitting: Emergency Medicine

## 2021-04-14 VITALS — BP 116/80 | HR 73 | Temp 98.6°F | Resp 18

## 2021-04-14 DIAGNOSIS — K5904 Chronic idiopathic constipation: Secondary | ICD-10-CM | POA: Insufficient documentation

## 2021-04-14 DIAGNOSIS — R1032 Left lower quadrant pain: Secondary | ICD-10-CM | POA: Diagnosis not present

## 2021-04-14 LAB — POCT URINALYSIS DIP (MANUAL ENTRY)
Bilirubin, UA: NEGATIVE
Blood, UA: NEGATIVE
Glucose, UA: NEGATIVE mg/dL
Ketones, POC UA: NEGATIVE mg/dL
Leukocytes, UA: NEGATIVE
Nitrite, UA: NEGATIVE
Protein Ur, POC: NEGATIVE mg/dL
Spec Grav, UA: 1.01 (ref 1.010–1.025)
Urobilinogen, UA: 0.2 E.U./dL
pH, UA: 6 (ref 5.0–8.0)

## 2021-04-14 MED ORDER — LINACLOTIDE 72 MCG PO CAPS: 72.0000 ug | ORAL_CAPSULE | Freq: Every day | ORAL | 0 refills | Status: DC

## 2021-04-14 MED ORDER — TRULANCE 3 MG PO TABS: 1.0000 | ORAL_TABLET | Freq: Every day | ORAL | 0 refills | Status: AC

## 2021-04-14 NOTE — ED Provider Notes (Signed)
[facility]    CSN: [id number] Arrival date & time: 04/14/21  1015    HISTORY   Chief Complaint  Patient presents with   Appointment   Abdominal Pain   HPI Robin Meadows is a 46 y.o. female. Patient states that yesterday she began to have left-sided abdominal pain that radiates down to her left groin.  Patient states he also feels nausea occasionally.  Patient was seen by GI in June of last year, patient reported intermittent episodes of constipation requiring treatment with Dulcolax, states not using Dulcolax more than once a month or so.  Patient was prescribed Linzess 72 mcg, at this time, patient states she is not currently taking this medication.  Patient has multiple visits with her primary and GI regarding family history of colon cancer.  Patient has had a colonoscopy, did have a single finding of a tubular adenoma in her colon which was benign.    The history is provided by the patient.  Past Medical History:  Diagnosis Date   Abnormal uterine bleeding    endometrial polyp   Anemia    Ovarian cyst    left   Vertigo    Patient Active Problem List   Diagnosis Date Noted   Status post laparoscopic hysterectomy 07/14/2019   Past Surgical History:  Procedure Laterality Date   ABDOMINAL HYSTERECTOMY     CYSTOSCOPY N/A 07/14/2019   Procedure: CYSTOSCOPY;  Surgeon: [name], MD;  Location: [facility];  Service: Gynecology;  Laterality: N/A;   TOTAL LAPAROSCOPIC HYSTERECTOMY WITH SALPINGECTOMY Bilateral 07/14/2019   Procedure: TOTAL LAPAROSCOPIC HYSTERECTOMY WITH BILATERAL SALPINGECTOMY W/ VAGINAL REMOVAL OF ENDOMETRIAL POLYP AND SUTURE OF VAGINAL LACERATION;  Surgeon: [name], MD;  Location: [place];  Service: Gynecology;  Laterality: Bilateral;   WISDOM TOOTH EXTRACTION     OB History     Gravida  3   Para  1   Term  1   Preterm      AB  2   Living  1      SAB  2    IAB      Ectopic      Multiple      Live Births             Home Medications    Prior to Admission medications   Not on File    Family History Family History  Problem Relation Age of Onset   Cancer Mother        ?cervical ca--dec age 80   Colon cancer Father        at age of 68   Diabetes Maternal Grandmother    Hypertension Maternal Grandfather    Cancer Maternal Grandfather        dec Lung cancer   Liver cancer Maternal Uncle    Social History Social History   Tobacco Use   Smoking status: Never   Smokeless tobacco: Never  Vaping Use   Vaping Use: Never used  Substance Use Topics   Alcohol use: Never   Drug use: Never   Allergies   Patient has no known allergies.  Review of Systems Review of Systems Pertinent findings noted in history of present illness.   Physical Exam Triage Vital Signs ED Triage Vitals  Enc Vitals Group     BP 01/13/21 0827 (!) 147/82     Pulse Rate 01/13/21 0827 72     Resp 01/13/21 0827  18     Temp 01/13/21 0827 98.3 F (36.8 C)     Temp Source 01/13/21 0827 Oral     SpO2 01/13/21 0827 98 %     Weight --      Height --      Head Circumference --      Peak Flow --      Pain Score 01/13/21 0826 5     Pain Loc --      Pain Edu? --      Excl. in [place]? --   No data found.  Updated Vital Signs BP 116/80 (BP Location: Right Arm)    Pulse 73    Temp 98.6 F (37 C) (Oral)    Resp 18    LMP 06/22/2019    SpO2 98%   Physical Exam Vitals and nursing note reviewed.  Constitutional:      General: She is not in acute distress.    Appearance: Normal appearance. She is morbidly obese. She is not ill-appearing.  HENT:     Head: Normocephalic and atraumatic.  Eyes:     General: Lids are normal.        Right eye: No discharge.        Left eye: No discharge.     Extraocular Movements: Extraocular movements intact.     Conjunctiva/sclera: Conjunctivae normal.     Right eye: Right conjunctiva is not injected.     Left eye: Left  conjunctiva is not injected.  Neck:     Trachea: Trachea and phonation normal.  Cardiovascular:     Rate and Rhythm: Normal rate and regular rhythm.     Pulses: Normal pulses.     Heart sounds: Normal heart sounds. No murmur heard.   No friction rub. No gallop.  Pulmonary:     Effort: Pulmonary effort is normal. No accessory muscle usage, prolonged expiration or respiratory distress.     Breath sounds: Normal breath sounds. No stridor, decreased air movement or transmitted upper airway sounds. No decreased breath sounds, wheezing, rhonchi or rales.  Chest:     Chest wall: No tenderness.  Abdominal:     General: Abdomen is flat. Bowel sounds are normal. There is no distension.     Palpations: Abdomen is soft.     Tenderness: There is generalized abdominal tenderness. There is no right CVA tenderness or left CVA tenderness.     Hernia: No hernia is present.     Comments: Abdomen is morbidly obese, physical exam limited due to body habitus.  Musculoskeletal:        General: Normal range of motion.     Cervical back: Normal range of motion and neck supple. Normal range of motion.  Lymphadenopathy:     Cervical: No cervical adenopathy.  Skin:    General: Skin is warm and dry.     Findings: No erythema or rash.  Neurological:     General: No focal deficit present.     Mental Status: She is alert and oriented to person, place, and time.  Psychiatric:        Mood and Affect: Mood normal.        Behavior: Behavior normal. Behavior is cooperative.    Visual Acuity Right Eye Distance:   Left Eye Distance:   Bilateral Distance:    Right Eye Near:   Left Eye Near:    Bilateral Near:     UC Couse / Diagnostics / Procedures:    EKG  Radiology No results  found.  Procedures Procedures (including critical care time)  UC Diagnoses / Final Clinical Impressions(s)   I have reviewed the triage vital signs and the nursing notes.  Pertinent labs & imaging results that were available  during my care of the patient were reviewed by me and considered in my medical decision making (see chart for details).    Final diagnoses:  Left lower quadrant abdominal pain  Chronic idiopathic constipation   Patient provided with a prescription of Linzess however her insurance may not pay for it so she was also provided with a prescription of Trulance should this be the case.  Patient advised to take daily as needed, plan to begin daily fiber regimen and ensure adequate hydration daily.  Return precautions advised.  Follow-up with GI as needed.  ED Prescriptions     Medication Sig Dispense Auth. Provider   linaclotide (LINZESS) 72 MCG capsule Take 1 capsule (72 mcg total) by mouth daily before breakfast. 30 capsule Lynden Oxford Scales, PA-C   Plecanatide (TRULANCE) 3 MG TABS Take 1 tablet by mouth daily. 30 tablet Lynden Oxford Scales, PA-C      PDMP not reviewed this encounter.  Pending results:  Labs Reviewed  URINE CULTURE  POCT URINALYSIS DIP (MANUAL ENTRY)    Medications Ordered in UC: Medications - No data to display  Disposition Upon Discharge:  Condition: stable for discharge home Home: take medications as prescribed; routine discharge instructions as discussed; follow up as advised.  Patient presented with an acute illness with associated systemic symptoms and significant discomfort requiring urgent management. In my opinion, this is a condition that a prudent lay person (someone who possesses an average knowledge of health and medicine) may potentially expect to result in complications if not addressed urgently such as respiratory distress, impairment of bodily function or dysfunction of bodily organs.   Routine symptom specific, illness specific and/or disease specific instructions were discussed with the patient and/or caregiver at length.   As such, the patient has been evaluated and assessed, work-up was performed and treatment was provided in alignment with  urgent care protocols and evidence based medicine.  Patient/parent/caregiver has been advised that the patient may require follow up for further testing and treatment if the symptoms continue in spite of treatment, as clinically indicated and appropriate.  If the patient was tested for COVID-19, Influenza and/or RSV, then the patient/parent/guardian was advised to isolate at home pending the results of his/her diagnostic coronavirus test and potentially longer if theyre positive. I have also advised pt that if his/her COVID-19 test returns positive, it's recommended to self-isolate for at least 10 days after symptoms first appeared AND until fever-free for 24 hours without fever reducer AND other symptoms have improved or resolved. Discussed self-isolation recommendations as well as instructions for household member/close contacts as per the [facility] and [place] [facility], and also gave patient the [place] packet with this information.  Patient/parent/caregiver has been advised to return to the [facility] or PCP in 3-5 days if no better; to PCP or the Emergency Department if new signs and symptoms develop, or if the current signs or symptoms continue to change or worsen for further workup, evaluation and treatment as clinically indicated and appropriate  The patient will follow up with their current PCP if and as advised. If the patient does not currently have a PCP we will assist them in obtaining one.   The patient may need specialty follow up if the symptoms continue, in spite of conservative treatment and management,  for further workup, evaluation, consultation and treatment as clinically indicated and appropriate.   Patient/parent/caregiver verbalized understanding and agreement of plan as discussed.  All questions were addressed during visit.  Please see discharge instructions below for further details of plan.  Discharge Instructions:   Discharge Instructions      For your intermittent episodes of constipation, I  provided you with a prescription for Linzess.  While signing a prescription, received notification that it may not be covered by your insurance.  For this reason I sent a second prescription for a similar medication called Trulance.  I advised the pharmacy to fill the 1 that is covered by her insurance.  These medications were the same.  As you know, you take them within a few hours you will have a meaningful and often relieving bowel movement.  You do not need to take this medication daily, only as needed.  I also recommend that you begin a daily fiber supplement such as 1 that contains psyllium husk.  Fiber supplements are best taken daily or twice daily, depending on how well your bowels respond to the regimen.  Please also be sure that you drink plenty of water throughout the day, between 80 and 120 ounces is considered therapeutic for constipation suffers.  Please follow-up with gastroenterology if you have not had relief of your symptoms.  Thank you for visiting urgent care today.  I appreciate the opportunity to participate in your care.      This office note has been dictated using Museum/gallery curator.  Unfortunately, and despite my best efforts, this method of dictation can sometimes lead to occasional typographical or grammatical errors.  I apologize in advance if this occurs.     Lynden Oxford Scales, PA-C 04/14/21 1123

## 2021-04-14 NOTE — Discharge Instructions (Addendum)
For your intermittent episodes of constipation, I provided you with a prescription for Linzess.  While signing a prescription, received notification that it may not be covered by your insurance.  For this reason I sent a second prescription for a similar medication called Trulance.  I advised the pharmacy to fill the 1 that is covered by her insurance.  These medications were the same.  As you know, you take them within a few hours you will have a meaningful and often relieving bowel movement.  You do not need to take this medication daily, only as needed.  I also recommend that you begin a daily fiber supplement such as 1 that contains psyllium husk.  Fiber supplements are best taken daily or twice daily, depending on how well your bowels respond to the regimen.  Please also be sure that you drink plenty of water throughout the day, between 80 and 120 ounces is considered therapeutic for constipation suffers.  Please follow-up with gastroenterology if you have not had relief of your symptoms.  Thank you for visiting urgent care today.  I appreciate the opportunity to participate in your care.

## 2021-04-14 NOTE — ED Triage Notes (Signed)
Pt reports having left abd pain that radiates down to left groin and nausea at times. Started: yesterday

## 2021-04-15 LAB — URINE CULTURE: Culture: NO GROWTH

## 2021-07-24 ENCOUNTER — Other Ambulatory Visit: Payer: Self-pay | Admitting: Obstetrics and Gynecology

## 2021-07-24 DIAGNOSIS — Z1231 Encounter for screening mammogram for malignant neoplasm of breast: Secondary | ICD-10-CM

## 2021-07-31 DIAGNOSIS — Z1231 Encounter for screening mammogram for malignant neoplasm of breast: Secondary | ICD-10-CM

## 2021-08-03 ENCOUNTER — Ambulatory Visit
Admission: RE | Admit: 2021-08-03 | Discharge: 2021-08-03 | Disposition: A | Discharge status: Home / Self Care | Payer: Medicaid Other | Source: Ambulatory Visit | Attending: Obstetrics and Gynecology | Admitting: Obstetrics and Gynecology

## 2021-08-03 DIAGNOSIS — Z1231 Encounter for screening mammogram for malignant neoplasm of breast: Secondary | ICD-10-CM

## 2021-08-08 NOTE — Progress Notes (Unsigned)
46 y.o. G79P1021 Married Caucasian female here for annual exam.    PCP: Lucky Cowboy, FNP    Patient's last menstrual period was 06/22/2019.           Sexually active: {yes no:314532}  The current method of family planning is status post hysterectomy.    Exercising: {yes no:314532}  {types:19826} Smoker:  no  Health Maintenance: Pap:  05-19-19 Neg:Neg HR HPV, 2019 normal per patient History of abnormal Pap:  no MMG:  08-03-21 Neg/Birads1 Colonoscopy:  10-17-20 polyps removed BMD:   n/a  Result  n/a TDaP:  06-16-20 Gardasil:   no HIV: Neg in the past Hep C: no Screening Labs:  Hb today: ***, Urine today: ***   reports that she has never smoked. She has never used smokeless tobacco. She reports that she does not drink alcohol and does not use drugs.  Past Medical History:  Diagnosis Date   Abnormal uterine bleeding    endometrial polyp   Anemia    Ovarian cyst    left   Vertigo     Past Surgical History:  Procedure Laterality Date   ABDOMINAL HYSTERECTOMY     CYSTOSCOPY N/A 07/14/2019   Procedure: CYSTOSCOPY;  Surgeon: [name], MD;  Location: [facility];  Service: Gynecology;  Laterality: N/A;   TOTAL LAPAROSCOPIC HYSTERECTOMY WITH SALPINGECTOMY Bilateral 07/14/2019   Procedure: TOTAL LAPAROSCOPIC HYSTERECTOMY WITH BILATERAL SALPINGECTOMY W/ VAGINAL REMOVAL OF ENDOMETRIAL POLYP AND SUTURE OF VAGINAL LACERATION;  Surgeon: [name], MD;  Location: [place];  Service: Gynecology;  Laterality: Bilateral;   WISDOM TOOTH EXTRACTION      Current Outpatient Medications  Medication Sig Dispense Refill   linaclotide (LINZESS) 72 MCG capsule Take 1 capsule (72 mcg total) by mouth daily before breakfast. 30 capsule 0   No current facility-administered medications for this visit.    Family History  Problem Relation Age of Onset   Cancer Mother        ?cervical ca--dec age 52   Colon cancer Father        at age  of 53   Diabetes Maternal Grandmother    Hypertension Maternal Grandfather    Cancer Maternal Grandfather        dec Lung cancer   Liver cancer Maternal Uncle     Review of Systems  Exam:   LMP 06/22/2019     General appearance: alert, cooperative and appears stated age Head: normocephalic, without obvious abnormality, atraumatic Neck: no adenopathy, supple, symmetrical, trachea midline and thyroid normal to inspection and palpation Lungs: clear to auscultation bilaterally Breasts: normal appearance, no masses or tenderness, No nipple retraction or dimpling, No nipple discharge or bleeding, No axillary adenopathy Heart: regular rate and rhythm Abdomen: soft, non-tender; no masses, no organomegaly Extremities: extremities normal, atraumatic, no cyanosis or edema Skin: skin color, texture, turgor normal. No rashes or lesions Lymph nodes: cervical, supraclavicular, and axillary nodes normal. Neurologic: grossly normal  Pelvic: External genitalia:  no lesions              No abnormal inguinal nodes palpated.              Urethra:  normal appearing urethra with no masses, tenderness or lesions              Bartholins and Skenes: normal                 Vagina: normal appearing vagina with normal color  and discharge, no lesions              Cervix: no lesions              Pap taken: {yes no:314532} Bimanual Exam:  Uterus:  normal size, contour, position, consistency, mobility, non-tender              Adnexa: no mass, fullness, tenderness              Rectal exam: {yes no:314532}.  Confirms.              Anus:  normal sphincter tone, no lesions  Chaperone was present for exam:  ***  Assessment:   Well woman visit with gynecologic exam.   Plan: Mammogram screening discussed. Self breast awareness reviewed. Pap and HR HPV as above. Guidelines for Calcium, Vitamin D, regular exercise program including cardiovascular and weight bearing exercise.   Follow up annually and prn.    Additional counseling given.  {yes [id number]. _______ minutes face to face time of which over 50% was spent in counseling.    After visit summary provided.

## 2021-08-09 ENCOUNTER — Ambulatory Visit (INDEPENDENT_AMBULATORY_CARE_PROVIDER_SITE_OTHER): Payer: 59 | Admitting: Obstetrics and Gynecology

## 2021-08-09 ENCOUNTER — Ambulatory Visit: Payer: Medicaid Other | Admitting: Obstetrics and Gynecology

## 2021-08-09 ENCOUNTER — Encounter: Payer: Self-pay | Admitting: Obstetrics and Gynecology

## 2021-08-09 VITALS — BP 110/68 | HR 100 | Ht 68.5 in | Wt 300.0 lb

## 2021-08-09 DIAGNOSIS — Z01419 Encounter for gynecological examination (general) (routine) without abnormal findings: Secondary | ICD-10-CM

## 2021-08-09 DIAGNOSIS — N898 Other specified noninflammatory disorders of vagina: Secondary | ICD-10-CM

## 2021-08-09 LAB — WET PREP FOR TRICH, YEAST, CLUE

## 2021-08-09 MED ORDER — METRONIDAZOLE 500 MG PO TABS: 500.0000 mg | ORAL_TABLET | Freq: Two times a day (BID) | ORAL | 0 refills | Status: DC

## 2021-08-09 NOTE — Progress Notes (Signed)
46 y.o. G58P1021 Married Caucasian female here for annual exam.    She is having vaginal discharge.  No itching  or burning.  No odor.  She wants a vaginitis check today.   No hot flashes.   Takes MVI.  PCP: Lucky Cowboy, FNP  Patient's last menstrual period was 06/22/2019.           Sexually active: Yes.    The current method of family planning is status post hysterectomy.    Exercising: No.  The patient does not participate in regular exercise at present. Smoker:  no  Health Maintenance: Pap:  05-19-19 Neg:Neg HR HPV, 2019 normal per patient History of abnormal Pap:  no MMG:  08-03-21 Neg/BiRads1 Colonoscopy: 10-17-20 polyps removed;next 10/2023 BMD:   n/a  Result  n/a TDaP:  06-16-20 Gardasil:   no HIV: Neg in the past Hep C: no Screening Labs:  PCP   reports that she has never smoked. She has never used smokeless tobacco. She reports that she does not drink alcohol and does not use drugs.  Past Medical History:  Diagnosis Date   Abnormal uterine bleeding    endometrial polyp   Anemia    Ovarian cyst    left   Vertigo     Past Surgical History:  Procedure Laterality Date   ABDOMINAL HYSTERECTOMY     CYSTOSCOPY N/A 07/14/2019   Procedure: CYSTOSCOPY;  Surgeon: [name], MD;  Location: [facility];  Service: Gynecology;  Laterality: N/A;   TOTAL LAPAROSCOPIC HYSTERECTOMY WITH SALPINGECTOMY Bilateral 07/14/2019   Procedure: TOTAL LAPAROSCOPIC HYSTERECTOMY WITH BILATERAL SALPINGECTOMY W/ VAGINAL REMOVAL OF ENDOMETRIAL POLYP AND SUTURE OF VAGINAL LACERATION;  Surgeon: [name], MD;  Location: [place];  Service: Gynecology;  Laterality: Bilateral;   WISDOM TOOTH EXTRACTION      Current Outpatient Medications  Medication Sig Dispense Refill   metroNIDAZOLE (FLAGYL) 500 MG tablet Take 1 tablet (500 mg total) by mouth 2 (two) times daily. 14 tablet 0   No current facility-administered medications for  this visit.    Family History  Problem Relation Age of Onset   Cancer Mother        ?cervical ca--dec age 39   Colon cancer Father        at age of 56   Diabetes Maternal Grandmother    Hypertension Maternal Grandfather    Cancer Maternal Grandfather        dec Lung cancer   Liver cancer Maternal Uncle     Review of Systems  All other systems reviewed and are negative.  Exam:   BP 110/68   Pulse 100   Ht 5' 8.5" (1.74 m)   Wt 300 lb (136.1 kg)   LMP 06/22/2019   SpO2 99%   BMI 44.95 kg/m     General appearance: alert, cooperative and appears stated age Head: normocephalic, without obvious abnormality, atraumatic Neck: no adenopathy, supple, symmetrical, trachea midline and thyroid normal to inspection and palpation Lungs: clear to auscultation bilaterally Breasts: normal appearance, no masses or tenderness, No nipple retraction or dimpling, No nipple discharge or bleeding, No axillary adenopathy Heart: regular rate and rhythm Abdomen: soft, non-tender; no masses, no organomegaly Extremities: extremities normal, atraumatic, no cyanosis or edema Skin: skin color, texture, turgor normal. No rashes or lesions Lymph nodes: cervical, supraclavicular, and axillary nodes normal. Neurologic: grossly normal  Pelvic: External genitalia:  no lesions  No abnormal inguinal nodes palpated.              Urethra:  normal appearing urethra with no masses, tenderness or lesions              Bartholins and Skenes: normal                 Vagina: normal appearing vagina with normal color and discharge, no lesions              Cervix:  absent.              Pap taken: no. Bimanual Exam:  Uterus:  absent              Adnexa: no mass, fullness, tenderness              Rectal exam: yes..  Confirms.              Anus:  normal sphincter tone, no lesions  Chaperone was present for exam:  yes.  Assessment:   Well woman visit with gynecologic exam. Status post total laparoscopic  hysterectomy with bilateral salpingectomy, cystoscopy, vaginal removal of endometrial polyp. FH colon cancer.  Vaginal discharge.  BV on wet prep.  Negative for yeast and trichomonas.   Plan: Mammogram screening discussed. Self breast awareness reviewed. Pap and HR HPV  not indicated.  Guidelines for Calcium, Vitamin D, regular exercise program including cardiovascular and weight bearing exercise. Flagyl 500 mg po bid x 7 days.   Follow up annually and prn.   After visit summary provided.   In addition to annual exam, patient was evaluated and treated for bacterial vaginosis.

## 2021-08-09 NOTE — Patient Instructions (Signed)

## 2021-10-05 ENCOUNTER — Ambulatory Visit (INDEPENDENT_AMBULATORY_CARE_PROVIDER_SITE_OTHER): Payer: 59 | Admitting: Radiology

## 2021-10-05 ENCOUNTER — Other Ambulatory Visit: Payer: Self-pay | Admitting: Radiology

## 2021-10-05 VITALS — BP 124/78

## 2021-10-05 DIAGNOSIS — N898 Other specified noninflammatory disorders of vagina: Secondary | ICD-10-CM

## 2021-10-05 DIAGNOSIS — N760 Acute vaginitis: Secondary | ICD-10-CM | POA: Diagnosis not present

## 2021-10-05 LAB — WET PREP FOR TRICH, YEAST, CLUE

## 2021-10-05 MED ORDER — FLUCONAZOLE 150 MG PO TABS: 150.0000 mg | ORAL_TABLET | ORAL | 0 refills | Status: DC

## 2021-10-05 MED ORDER — NYSTATIN 100000 UNIT/GM EX OINT: 1.0000 | TOPICAL_OINTMENT | Freq: Two times a day (BID) | CUTANEOUS | 0 refills | Status: AC

## 2021-10-05 MED ORDER — TRIAMCINOLONE ACETONIDE 0.5 % EX OINT: 1.0000 | TOPICAL_OINTMENT | Freq: Two times a day (BID) | CUTANEOUS | 0 refills | Status: AC

## 2021-10-05 MED ORDER — NYSTATIN-TRIAMCINOLONE 100000-0.1 UNIT/GM-% EX OINT: 1.0000 | TOPICAL_OINTMENT | Freq: Two times a day (BID) | CUTANEOUS | 0 refills | Status: DC

## 2021-10-05 NOTE — Telephone Encounter (Signed)
New Rx sent.

## 2021-10-05 NOTE — Progress Notes (Signed)
      Subjective: Robin Meadows is a 46 y.o. female who complains of vaginal itching. She was treated for BV 6 weeks ago with Flagyl. Soon after she began having an internal and external itch. She tried OTC yeast suppositories last week but symptoms returned.    Review of Systems  All other systems reviewed and are negative.   Today's Vitals   10/05/21 0909  BP: 124/78   There is no height or weight on file to calculate BMI.   Objective:  -Vulva: without lesions or discharge -Vagina: scant white discharge present, wet prep obtained -Cervix: absent -Perineum: no lesions -Uterus: absent -Adnexa: no masses or tenderness   Microscopic wet-mount exam shows hyphae.   Chaperone offered and declined.  Assessment:/Plan:  1. Vulvovaginitis  - nystatin-triamcinolone ointment (MYCOLOG); Apply 1 Application topically 2 (two) times daily.  Dispense: 30 g; Refill: 0 - fluconazole (DIFLUCAN) 150 MG tablet; Take 1 tablet (150 mg total) by mouth every 3 (three) days.  Dispense: 2 tablet; Refill: 0  2. Vaginal itching  - WET PREP FOR TRICH, YEAST, CLUE     Avoid intercourse until symptoms are resolved. Avoid the use of soaps or perfumed products in the peri area. Avoid tub baths and sitting in sweaty or wet clothing for prolonged periods of time.

## 2021-10-05 NOTE — Telephone Encounter (Signed)
Yes, thank you.

## 2021-10-05 NOTE — Telephone Encounter (Signed)
[facility] needs PA, okay to send separate Rx's for nystatin and triamcinolone? Rx is typically cheaper when sent separated.

## 2022-03-19 DIAGNOSIS — N900 Mild vulvar dysplasia: Secondary | ICD-10-CM

## 2022-03-19 DIAGNOSIS — N890 Mild vaginal dysplasia: Secondary | ICD-10-CM

## 2022-03-19 HISTORY — DX: Mild vaginal dysplasia: N89.0

## 2022-03-19 HISTORY — DX: Mild vulvar dysplasia: N90.0

## 2022-07-11 IMAGING — MG MM DIGITAL SCREENING BILAT W/ TOMO AND CAD
8 series · 8 of 24 positions shown · non-contrast
Comparison: Previous exam(s).

CLINICAL DATA: Screening.

EXAM:
DIGITAL SCREENING BILATERAL MAMMOGRAM WITH TOMOSYNTHESIS AND CAD
TECHNIQUE: Bilateral screening digital craniocaudal and mediolateral oblique
mammograms were obtained. Bilateral screening digital breast
tomosynthesis was performed. The images were evaluated with
computer-aided detection.

[R MLO synth-2D]
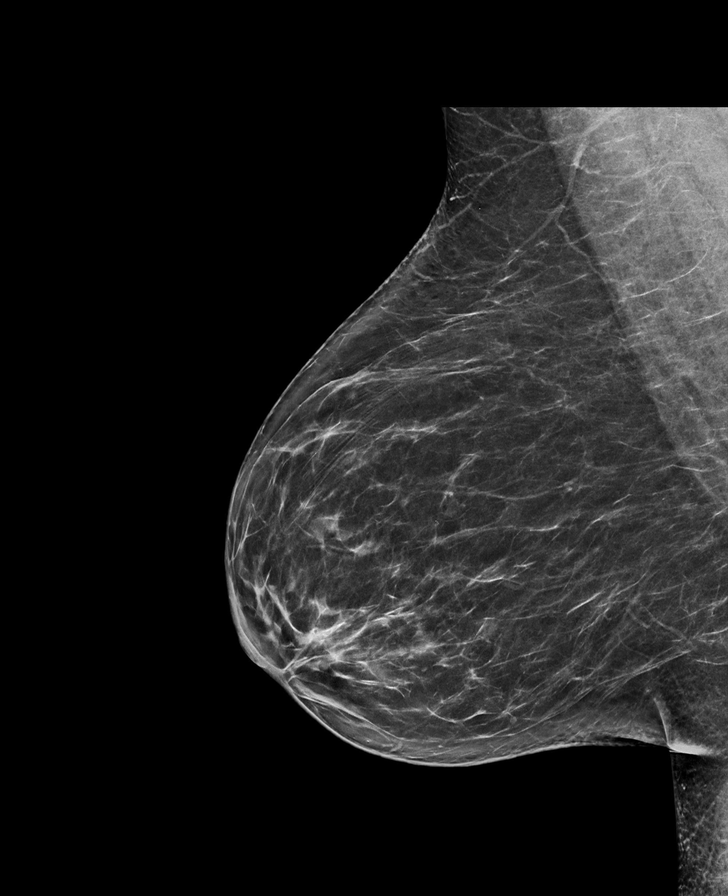

[L MLO synth-2D]
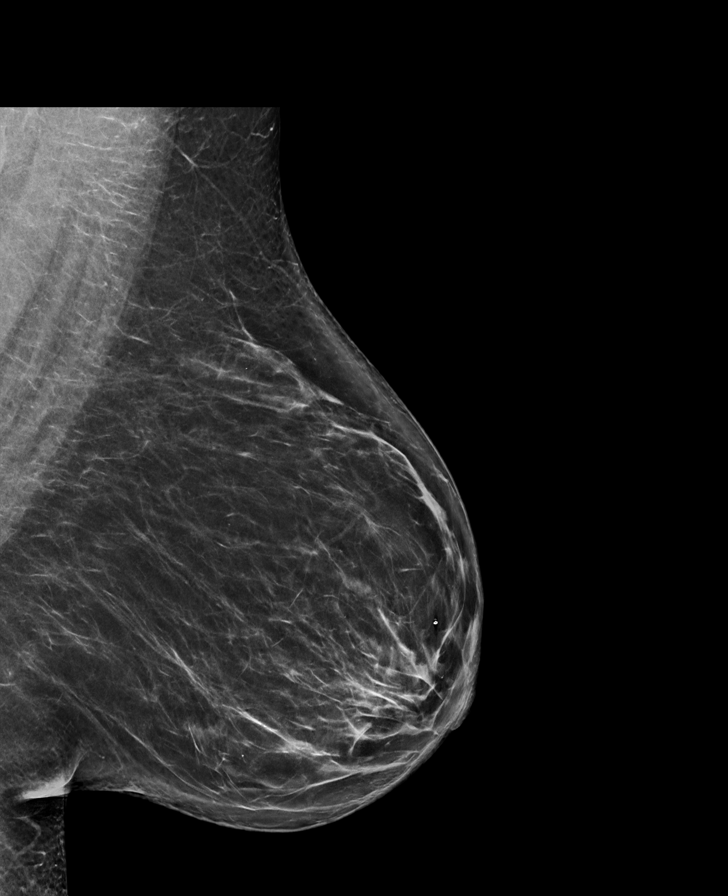

[L CC synth-2D]
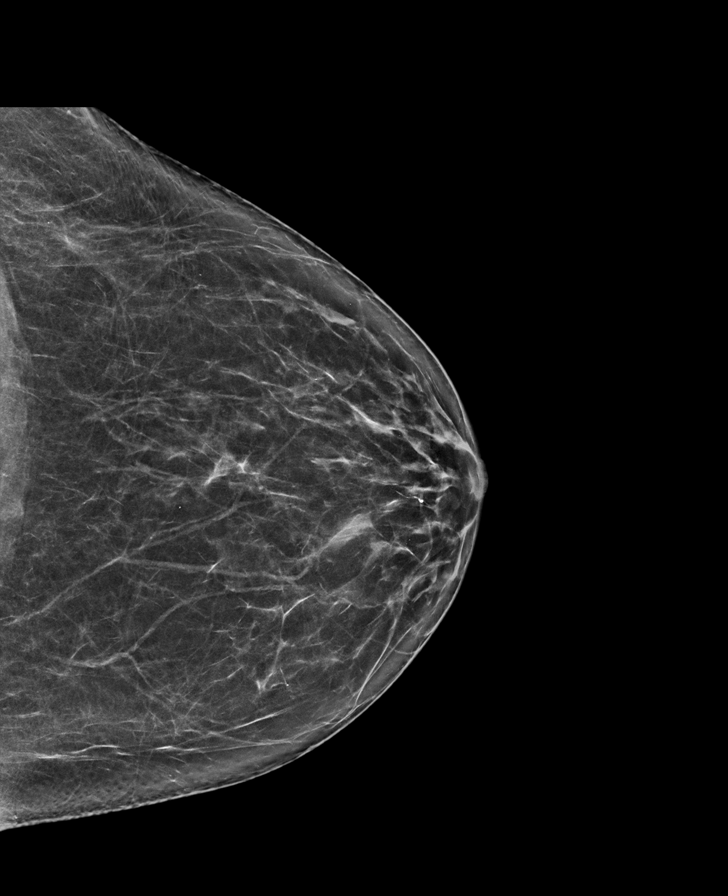

[R CC synth-2D]
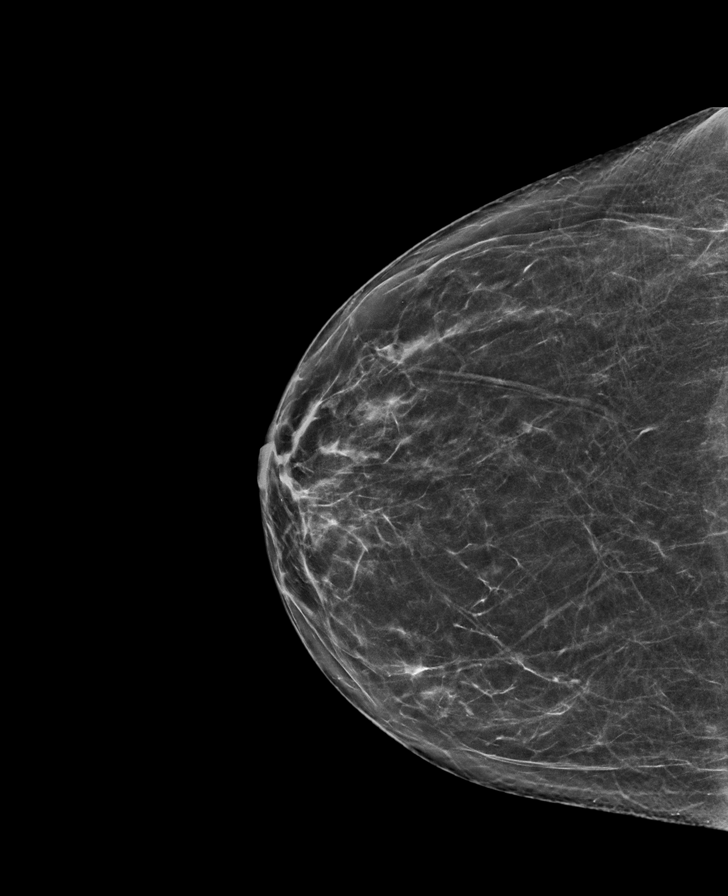

[R MLO tomo · tomo slice 38/75.0]
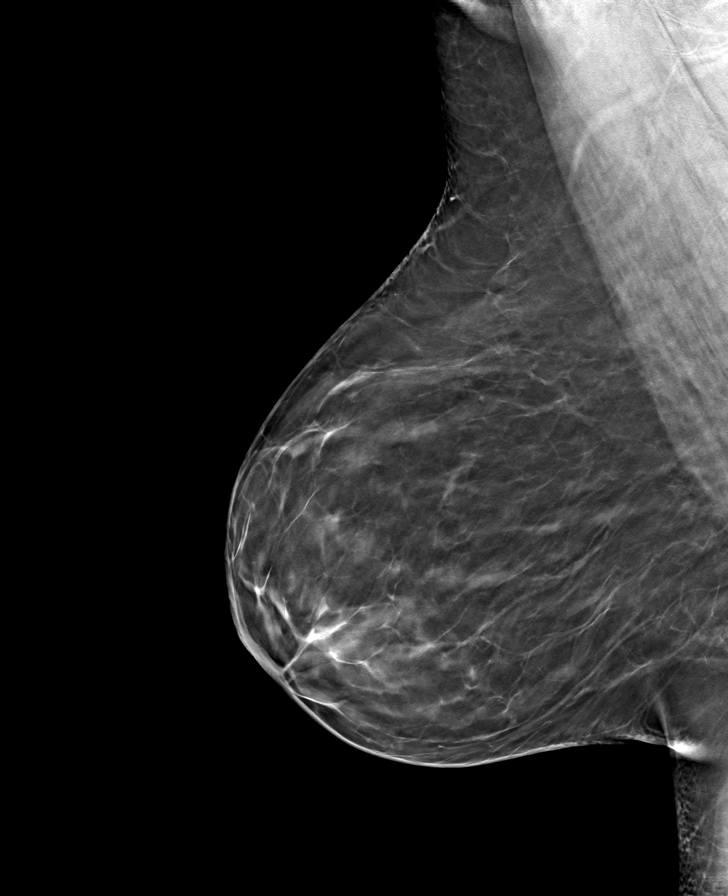

[L CC tomo · tomo slice 35/69.0]
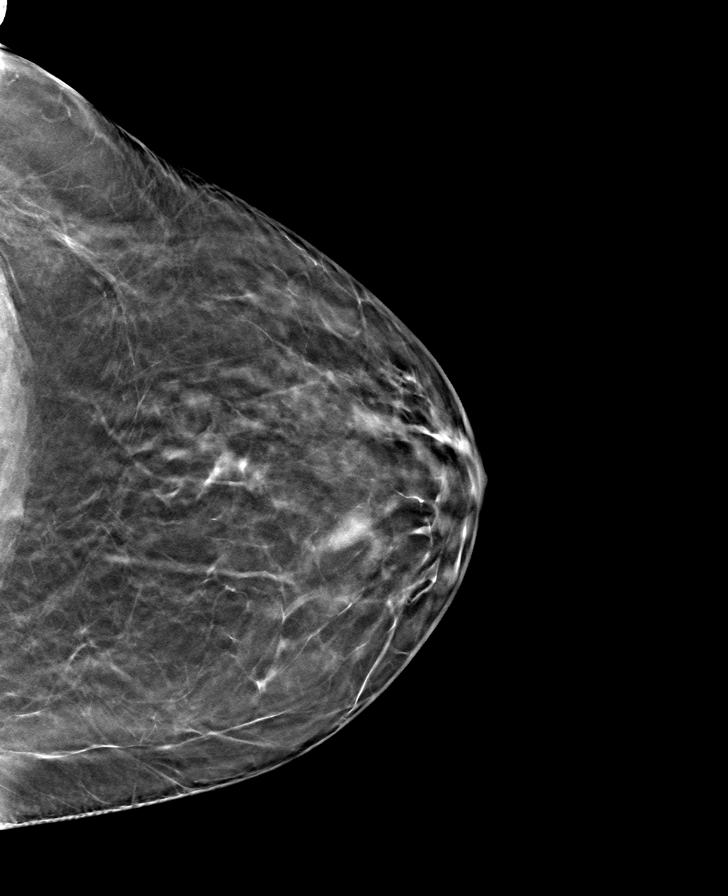

[L MLO tomo · tomo slice 39/78.0]
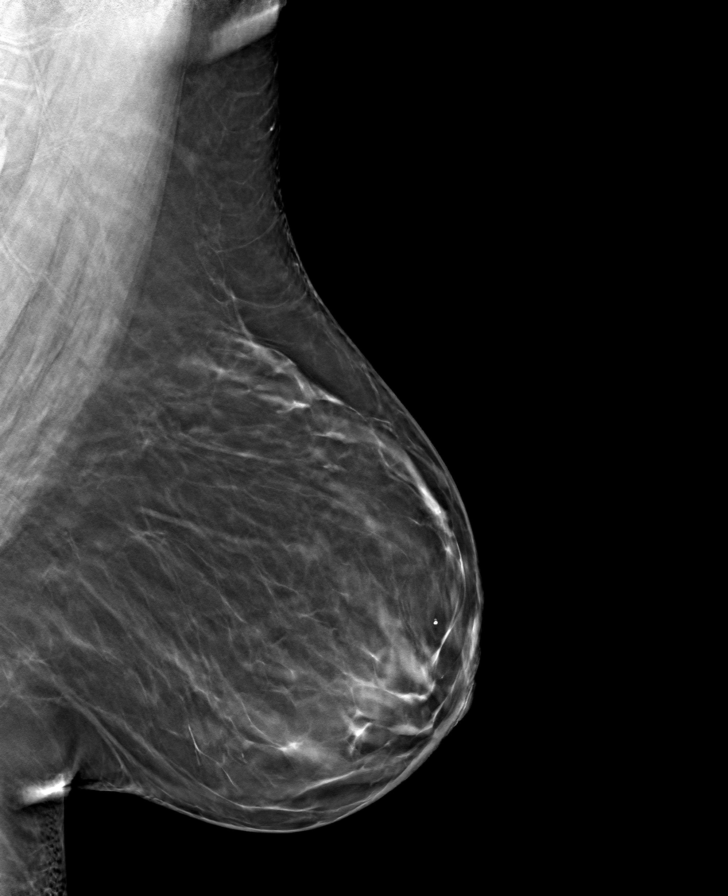

[R CC tomo · tomo slice 33/66.0]
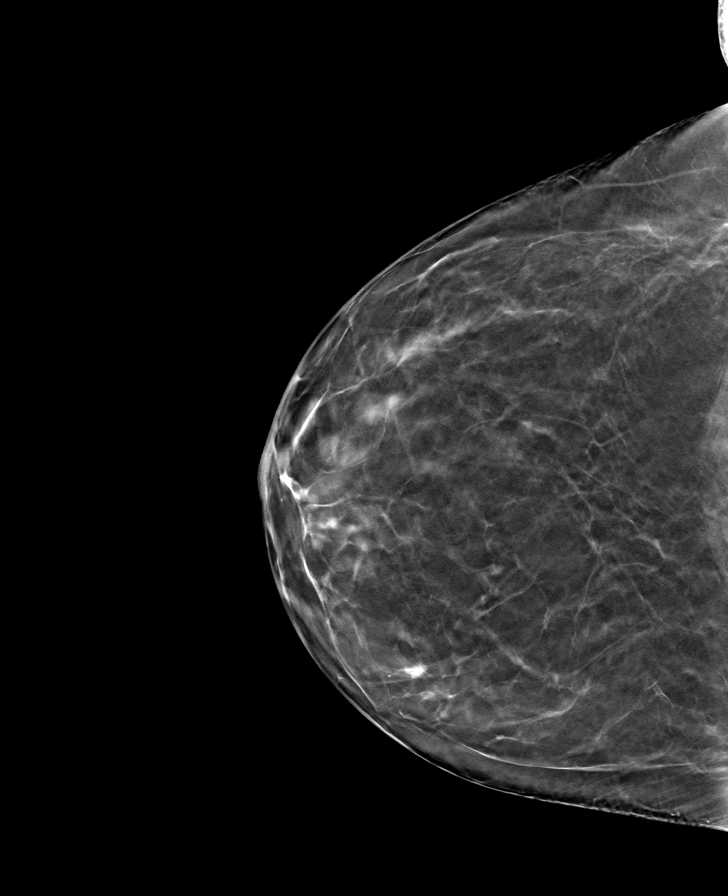

[8 of 24 positions shown; findings below may reference images not displayed]

ACR Breast Density Category b: There are scattered areas of
fibroglandular density.
FINDINGS: There are no findings suspicious for malignancy.
IMPRESSION: No mammographic evidence of malignancy. A result letter of this
screening mammogram will be mailed directly to the patient.

RECOMMENDATION:
Screening mammogram in one year. (Code:51-O-LD2)

BI-RADS CATEGORY  1: Negative.

## 2022-07-16 ENCOUNTER — Other Ambulatory Visit: Payer: Self-pay | Admitting: Obstetrics and Gynecology

## 2022-07-16 DIAGNOSIS — Z1231 Encounter for screening mammogram for malignant neoplasm of breast: Secondary | ICD-10-CM

## 2022-07-20 DIAGNOSIS — Z1231 Encounter for screening mammogram for malignant neoplasm of breast: Secondary | ICD-10-CM

## 2022-08-17 ENCOUNTER — Ambulatory Visit
Admission: RE | Admit: 2022-08-17 | Discharge: 2022-08-17 | Disposition: A | Discharge status: Home / Self Care | Payer: Medicaid Other | Source: Ambulatory Visit

## 2022-08-17 DIAGNOSIS — Z1231 Encounter for screening mammogram for malignant neoplasm of breast: Secondary | ICD-10-CM

## 2022-10-02 NOTE — Progress Notes (Deleted)
47 y.o. G61P1021 Married Caucasian female here for annual exam.    PCP:     Patient's last menstrual period was 06/22/2019.           Sexually active: {yes no:314532}  The current method of family planning is status post hysterectomy.    Exercising: {yes no:314532}  {types:19826} Smoker:  no  Health Maintenance: Pap:  05-19-19 Neg:Neg HR HPV, 2019 normal per patient  History of abnormal Pap:  no MMG:  08/17/22 Breast Density Cat B, BI-RADS CAT 1 neg Colonoscopy:   10-17-20 polyps removed;next 10/2023  BMD:   n/a  Result  n/a TDaP:  06/16/20 Gardasil:   no HIV: neg Hep C: neg Screening Labs:  Hb today: ***, Urine today: ***   reports that she has never smoked. She has never used smokeless tobacco. She reports that she does not drink alcohol and does not use drugs.  Past Medical History:  Diagnosis Date   Abnormal uterine bleeding    endometrial polyp   Anemia    Ovarian cyst    left   Vertigo     Past Surgical History:  Procedure Laterality Date   ABDOMINAL HYSTERECTOMY     CYSTOSCOPY N/A 07/14/2019   Procedure: CYSTOSCOPY;  Surgeon: Patton Salles, MD;  Location: [facility];  Service: Gynecology;  Laterality: N/A;   TOTAL LAPAROSCOPIC HYSTERECTOMY WITH SALPINGECTOMY Bilateral 07/14/2019   Procedure: TOTAL LAPAROSCOPIC HYSTERECTOMY WITH BILATERAL SALPINGECTOMY W/ VAGINAL REMOVAL OF ENDOMETRIAL POLYP AND SUTURE OF VAGINAL LACERATION;  Surgeon: Patton Salles, MD;  Location: [facility];  Service: Gynecology;  Laterality: Bilateral;   WISDOM TOOTH EXTRACTION      Current Outpatient Medications  Medication Sig Dispense Refill   fluconazole (DIFLUCAN) 150 MG tablet Take 1 tablet (150 mg total) by mouth every 3 (three) days. 2 tablet 0   nystatin ointment (MYCOSTATIN) Apply 1 Application topically 2 (two) times daily. 30 g 0   triamcinolone ointment (KENALOG) 0.5 % Apply 1 Application topically 2 (two) times daily. 30 g 0   No  current facility-administered medications for this visit.    Family History  Problem Relation Age of Onset   Cancer Mother        ?cervical ca--dec age 84   Colon cancer Father        at age of 94   Diabetes Maternal Grandmother    Hypertension Maternal Grandfather    Cancer Maternal Grandfather        dec Lung cancer   Liver cancer Maternal Uncle     Review of Systems  Exam:   LMP 06/22/2019     General appearance: alert, cooperative and appears stated age Head: normocephalic, without obvious abnormality, atraumatic Neck: no adenopathy, supple, symmetrical, trachea midline and thyroid normal to inspection and palpation Lungs: clear to auscultation bilaterally Breasts: normal appearance, no masses or tenderness, No nipple retraction or dimpling, No nipple discharge or bleeding, No axillary adenopathy Heart: regular rate and rhythm Abdomen: soft, non-tender; no masses, no organomegaly Extremities: extremities normal, atraumatic, no cyanosis or edema Skin: skin color, texture, turgor normal. No rashes or lesions Lymph nodes: cervical, supraclavicular, and axillary nodes normal. Neurologic: grossly normal  Pelvic: External genitalia:  no lesions              No abnormal inguinal nodes palpated.              Urethra:  normal appearing urethra with no masses, tenderness or  lesions              Bartholins and Skenes: normal                 Vagina: normal appearing vagina with normal color and discharge, no lesions              Cervix: no lesions              Pap taken: {yes no:314532} Bimanual Exam:  Uterus:  normal size, contour, position, consistency, mobility, non-tender              Adnexa: no mass, fullness, tenderness              Rectal exam: {yes no:314532}.  Confirms.              Anus:  normal sphincter tone, no lesions  Chaperone was present for exam:  ***  Assessment:   Well woman visit with gynecologic exam.   Plan: Mammogram screening discussed. Self breast  awareness reviewed. Pap and HR HPV as above. Guidelines for Calcium, Vitamin D, regular exercise program including cardiovascular and weight bearing exercise.   Follow up annually and prn.   Additional counseling given.  {yes [id number]. _______ minutes face to face time of which over 50% was spent in counseling.    After visit summary provided.

## 2022-10-08 NOTE — Progress Notes (Unsigned)
47 y.o. G82P1021 Married Caucasian female here for annual exam.    Wants testing for bacterial vaginosis.  States infection 1 - 2 times per year. Having vaginal discharge and some slight odor.  Occasional itching.  No over the counter medication use.   Daughter will be a Holiday representative in high school.  PCP: Newton Pigg, NP    Patient's last menstrual period was 06/22/2019.           Sexually active: Yes.    The current method of family planning is status post hysterectomy.    Exercising: Yes.     More house work; walking around the home. Smoker: no  Health Maintenance: Pap:  05-19-19 Neg:Neg HR HPV, 2019 normal per patient  History of abnormal Pap: no MMG:  08/17/22 Breast Density Cat B, BI-RADS CAT 1 neg Colonoscopy: 10-17-20 polyps removed;next 10/2023  BMD:   n/a  Result  n/a TDaP:  06/16/20 Gardasil: no HIV: NR in past Hep C: neg in past Screening Labs:  PCP   reports that she has never smoked. She has never used smokeless tobacco. She reports that she does not drink alcohol and does not use drugs.  Past Medical History:  Diagnosis Date   Abnormal uterine bleeding    endometrial polyp   Anemia    Ovarian cyst    left   Vertigo     Past Surgical History:  Procedure Laterality Date   ABDOMINAL HYSTERECTOMY     CYSTOSCOPY N/A 07/14/2019   Procedure: CYSTOSCOPY;  Surgeon: Patton Salles, MD;  Location: [facility];  Service: Gynecology;  Laterality: N/A;   TOTAL LAPAROSCOPIC HYSTERECTOMY WITH SALPINGECTOMY Bilateral 07/14/2019   Procedure: TOTAL LAPAROSCOPIC HYSTERECTOMY WITH BILATERAL SALPINGECTOMY W/ VAGINAL REMOVAL OF ENDOMETRIAL POLYP AND SUTURE OF VAGINAL LACERATION;  Surgeon: Patton Salles, MD;  Location: [facility];  Service: Gynecology;  Laterality: Bilateral;   WISDOM TOOTH EXTRACTION      Current Outpatient Medications  Medication Sig Dispense Refill   Multiple Vitamin (MULTIVITAMIN WITH MINERALS) TABS tablet  Take 1 tablet by mouth daily.     nystatin ointment (MYCOSTATIN) Apply 1 Application topically 2 (two) times daily. 30 g 0   triamcinolone ointment (KENALOG) 0.5 % Apply 1 Application topically 2 (two) times daily. 30 g 0   No current facility-administered medications for this visit.    Family History  Problem Relation Age of Onset   Cancer Mother        ?cervical ca--dec age 32   Colon cancer Father        at age of 33   Diabetes Maternal Grandmother    Hypertension Maternal Grandfather    Cancer Maternal Grandfather        dec Lung cancer   Liver cancer Maternal Uncle     Review of Systems  All other systems reviewed and are negative.   Exam:   BP 102/72   Pulse 90   Ht 5' 9.25" (1.759 m)   Wt (!) 302 lb (137 kg)   LMP 06/22/2019   SpO2 98%   BMI 44.28 kg/m     General appearance: alert, cooperative and appears stated age Head: normocephalic, without obvious abnormality, atraumatic Neck: no adenopathy, supple, symmetrical, trachea midline and thyroid normal to inspection and palpation Lungs: clear to auscultation bilaterally Breasts: normal appearance, no masses or tenderness, No nipple retraction or dimpling, No nipple discharge or bleeding, No axillary adenopathy Heart: regular rate and rhythm Abdomen: soft,  non-tender; no masses, no organomegaly Extremities: extremities normal, atraumatic, no cyanosis or edema Skin: skin color, texture, turgor normal. No rashes or lesions Lymph nodes: cervical, supraclavicular, and axillary nodes normal. Neurologic: grossly normal  Pelvic: External genitalia:  no lesions              No abnormal inguinal nodes palpated.              Urethra:  normal appearing urethra with no masses, tenderness or lesions              Bartholins and Skenes: normal                 Vagina: normal appearing vagina with normal color and discharge.  Vaginal cuff lesion noted.              Cervix: absent.  1 cm white raised lesion at vaginal apex.                 Pap taken: yes Bimanual Exam:  Uterus:  absent              Adnexa: no mass, fullness, tenderness              Rectal exam: declined.  Chaperone was present for exam:  Rosette Reveal, CMA  Assessment:   Well woman visit with gynecologic exam. Status post total laparoscopic hysterectomy with bilateral salpingectomy, cystoscopy, vaginal removal of endometrial polyp. Vaginal discharge. Vaginal lesion.  Uncertain etiology. FH colon cancer.  Elevated triglycerides.  Plan: Mammogram screening discussed. Self breast awareness reviewed. Pap and HR HPV collected. Vaginitis testing with [facility].  Guidelines for Calcium, Vitamin D, regular exercise program including cardiovascular and weight bearing exercise. Return for colposcopy and biopsy.  Procedure and rationale explained.  She will do labs with her PCP. Follow up annually and prn.

## 2022-10-09 ENCOUNTER — Ambulatory Visit: Payer: Medicaid Other | Admitting: Obstetrics and Gynecology

## 2022-10-22 ENCOUNTER — Ambulatory Visit (INDEPENDENT_AMBULATORY_CARE_PROVIDER_SITE_OTHER): Payer: Medicaid Other | Admitting: Obstetrics and Gynecology

## 2022-10-22 ENCOUNTER — Encounter: Payer: Self-pay | Admitting: Obstetrics and Gynecology

## 2022-10-22 ENCOUNTER — Other Ambulatory Visit (HOSPITAL_COMMUNITY)
Admission: RE | Admit: 2022-10-22 | Discharge: 2022-10-22 | Disposition: A | Discharge status: Home / Self Care | Payer: Medicaid Other | Source: Ambulatory Visit | Attending: Obstetrics and Gynecology | Admitting: Obstetrics and Gynecology

## 2022-10-22 VITALS — BP 102/72 | HR 90 | Ht 69.25 in | Wt 302.0 lb

## 2022-10-22 DIAGNOSIS — Z01419 Encounter for gynecological examination (general) (routine) without abnormal findings: Secondary | ICD-10-CM

## 2022-10-22 DIAGNOSIS — N898 Other specified noninflammatory disorders of vagina: Secondary | ICD-10-CM | POA: Insufficient documentation

## 2022-10-22 NOTE — Patient Instructions (Addendum)
Colposcopy  Colposcopy is a procedure to examine the lowest part of the uterus (cervix) for abnormalities or signs of disease. This procedure is done using an instrument that makes objects appear larger and provides light (colposcope). During the procedure, your health care provider may remove a tissue sample to look at under a microscope (biopsy). A biopsy may be done if any unusual cells are seen during the colposcopy. You may have a colposcopy if you have: An abnormal Pap smear, also called a Pap test. This screening test is used to check for signs of cancer or infection of the vagina, cervix, and uterus. An HPV (human papillomavirus) test and get a positive result for a type of HPV that puts you at high risk of cancer. Certain conditions or symptoms, such as: A sore, or lesion, on your cervix. Genital warts on your vulva, vagina, or cervix. Pain during sex. Vaginal bleeding, especially after sex. A growth on your cervix (cervical polyp) that needs to be removed. Let your health care provider know about: Any allergies you have, including allergies to medicines, latex, or iodine. All medicines you are taking, including vitamins, herbs, eye drops, creams, and over-the-counter medicines. Any bleeding problems you have. Any surgeries you have had. Any medical conditions you have, such as pelvic inflammatory disease (PID) or an endometrial disorder. The pattern of your menstrual cycles and the form of birth control (contraception) you use, if any. Your medical history, including any cervical treatments and how well you tolerated the procedure (if you have ever fainted). Whether you are pregnant or may be pregnant. What are the risks? Generally, this is a safe procedure. However, problems may occur, including: Infection. Symptoms of infection may include fever, bad-smelling vaginal discharge, or pelvic pain. Vaginal bleeding. Allergic reactions to medicines. Damage to nearby structures or  organs. What happens before the procedure? Medicines Ask your health care provider about: Changing or stopping your regular medicines. This is especially important if you are taking diabetes medicines or blood thinners. Taking medicines such as aspirin and ibuprofen. These medicines can thin your blood. Do not take these medicines unless your health care provider tells you to take them. Your health care provider will likely tell you to avoid taking aspirin, or medicine that contains aspirin, for 7 days before the procedure. Taking over-the-counter medicines, vitamins, herbs, and supplements. General instructions Tell your health care provider if you have your menstrual period now or will have it at the time of your procedure. A colposcopy is not normally done during your menstrual period. If you use contraception, continue to use it before your procedure. For 24 hours before the procedure: Do not use douche products or tampons. Do not use medicines, creams, or suppositories in the vagina. Do not have sex or insert anything into your vagina. Ask your health care provider what steps will be taken to prevent infection. What happens during the procedure? You will lie down on your back, with your feet in foot rests (stirrups). An instrument called a speculum will be inserted into your vagina. This will be used so your health care provider can see your cervix and the inside of your vagina. A cotton swab will be used to place a small amount of a liquid (solution) on the areas to be examined. This solution makes it easier to see abnormal cells. You may feel a slight burning during this part. The colposcope will be used to scan the cervix with a bright white light. The colposcope will be held near  your vulva and will make your vulva, vagina, and cervix look bigger so they can be seen better. If a biopsy is needed: You may be given a medicine to numb the area (local anesthetic). Surgical tools will be  used to remove mucus and cells through your vagina. You may feel mild pain while the tissue sample is removed. Bleeding may occur. A solution may be used to stop the bleeding. The tissue removed will be sent to a lab to be looked at under a microscope. The procedure may vary among health care providers and hospitals. What happens after the procedure? You may have some cramping in your abdomen. This should go away after a few minutes. It is up to you to get the results of your procedure. Ask your health care provider, or the department that is doing the procedure, when your results will be ready. Summary Colposcopy is a procedure to examine the lowest part of the uterus (cervix), for signs of disease. A biopsy may be done as part of the procedure. You may have some cramping in your abdomen. This should go away after a few minutes. It is up to you to get the results of your procedure. Ask your health care provider, or the department that is doing the procedure, when your results will be ready. This information is not intended to replace advice given to you by your health care provider. Make sure you discuss any questions you have with your health care provider. Document Revised: 07/31/2020 Document Reviewed: 07/31/2020 Elsevier Patient Education  2024 [employer]   EXERCISE AND DIET:  We recommended that you start or continue a regular exercise program for good health. Regular exercise means any activity that makes your heart beat faster and makes you sweat.  We recommend exercising at least 30 minutes per day at least 3 days a week, preferably 4 or 5.  We also recommend a diet low in fat and sugar.  Inactivity, poor dietary choices and obesity can cause diabetes, heart attack, stroke, and kidney damage, among others.    ALCOHOL AND SMOKING:  Women should limit their alcohol intake to no more than 7 drinks/beers/glasses of wine (combined, not each!) per week. Moderation of alcohol intake to this  level decreases your risk of breast cancer and liver damage. And of course, no recreational drugs are part of a healthy lifestyle.  And absolutely no smoking or even second hand smoke. Most people know smoking can cause heart and lung diseases, but did you know it also contributes to weakening of your bones? Aging of your skin?  Yellowing of your teeth and nails?  CALCIUM AND VITAMIN D:  Adequate intake of calcium and Vitamin D are recommended.  The recommendations for exact amounts of these supplements seem to change often, but generally speaking 600 mg of calcium (either carbonate or citrate) and 800 units of Vitamin D per day seems prudent. Certain women may benefit from higher intake of Vitamin D.  If you are among these women, your doctor will have told you during your visit.    PAP SMEARS:  Pap smears, to check for cervical cancer or precancers,  have traditionally been done yearly, although recent scientific advances have shown that most women can have pap smears less often.  However, every woman still should have a physical exam from her gynecologist every year. It will include a breast check, inspection of the vulva and vagina to check for abnormal growths or skin changes, a visual exam of the  cervix, and then an exam to evaluate the size and shape of the uterus and ovaries.  And after 47 years of age, a rectal exam is indicated to check for rectal cancers. We will also provide age appropriate advice regarding health maintenance, like when you should have certain vaccines, screening for sexually transmitted diseases, bone density testing, colonoscopy, mammograms, etc.   MAMMOGRAMS:  All women over 25 years old should have a yearly mammogram. Many facilities now offer a "3D" mammogram, which may cost around $50 extra out of pocket. If possible,  we recommend you accept the option to have the 3D mammogram performed.  It both reduces the number of women who will be called back for extra views which then  turn out to be normal, and it is better than the routine mammogram at detecting truly abnormal areas.    COLONOSCOPY:  Colonoscopy to screen for colon cancer is recommended for all women at age 99.  We know, you hate the idea of the prep.  We agree, BUT, having colon cancer and not knowing it is worse!!  Colon cancer so often starts as a polyp that can be seen and removed at colonscopy, which can quite literally save your life!  And if your first colonoscopy is normal and you have no family history of colon cancer, most women don't have to have it again for 10 years.  Once every ten years, you can do something that may end up saving your life, right?  We will be happy to help you get it scheduled when you are ready.  Be sure to check your insurance coverage so you understand how much it will cost.  It may be covered as a preventative service at no cost, but you should check your particular policy.    Calcium is the most abundant mineral in the body. Most of the body's calcium supply is stored in bones and teeth. Calcium helps many parts of the body function normally, including: Blood and blood vessels. Nerves. Hormones. Muscles. Bones and teeth. When your calcium stores are low, you may be at risk for low bone mass, bone loss, and broken bones (fractures). When you get enough calcium, it helps to support strong bones and teeth throughout your life. Calcium is especially important for: Children during growth spurts. Girls during adolescence. Women who are pregnant or breastfeeding. Women after their menstrual cycle stops (postmenopause). Women whose menstrual cycle has stopped due to anorexia nervosa or regular intense exercise. People who cannot eat or digest dairy products. Vegans. Recommended daily amounts of calcium: Women (ages 24 to 38): 1,000 mg per day. Women (ages 35 and older): 1,200 mg per day. Men (ages 71 to 69): 1,000 mg per day. Men (ages 24 and older): 1,200 mg per day. Women  (ages 61 to 30): 1,300 mg per day. Men (ages 39 to 68): 1,300 mg per day. General information Eat foods that are high in calcium. Try to get most of your calcium from food. Some people may benefit from taking calcium supplements. Check with your health care provider or diet and nutrition specialist (dietitian) before starting any calcium supplements. Calcium supplements may interact with certain medicines. Too much calcium may cause other health problems, such as constipation and kidney stones. For the body to absorb calcium, it needs vitamin D. Sources of vitamin D include: Skin exposure to direct sunlight. Foods, such as egg yolks, liver, mushrooms, saltwater fish, and fortified milk. Vitamin D supplements. Check with your health care provider or  dietitian before starting any vitamin D supplements. What foods are high in calcium?  Foods that are high in calcium contain more than 100 milligrams per serving. Fruits Fortified orange juice or other fruit juice, 300 mg per 8 oz serving. Vegetables Collard greens, 360 mg per 8 oz serving. Kale, 100 mg per 8 oz serving. Bok choy, 160 mg per 8 oz serving. Grains Fortified ready-to-eat cereals, 100 to 1,000 mg per 8 oz serving. Fortified frozen waffles, 200 mg in 2 waffles. Oatmeal, 140 mg in 1 cup. Meats and other proteins Sardines, canned with bones, 325 mg per 3 oz serving. Salmon, canned with bones, 180 mg per 3 oz serving. Canned shrimp, 125 mg per 3 oz serving. Baked beans, 160 mg per 4 oz serving. Tofu, firm, made with calcium sulfate, 253 mg per 4 oz serving. Dairy Yogurt, plain, low-fat, 310 mg per 6 oz serving. Nonfat milk, 300 mg per 8 oz serving. American cheese, 195 mg per 1 oz serving. Cheddar cheese, 205 mg per 1 oz serving. Cottage cheese 2%, 105 mg per 4 oz serving. Fortified soy, rice, or almond milk, 300 mg per 8 oz serving. Mozzarella, part skim, 210 mg per 1 oz serving. The items listed above may not be a complete list  of foods high in calcium. Actual amounts of calcium may be different depending on processing. Contact a dietitian for more information. What foods are lower in calcium? Foods that are lower in calcium contain 50 mg or less per serving. Fruits Apple, about 6 mg. Banana, about 12 mg. Vegetables Lettuce, 19 mg per 2 oz serving. Tomato, about 11 mg. Grains Rice, 4 mg per 6 oz serving. Boiled potatoes, 14 mg per 8 oz serving. White bread, 6 mg per slice. Meats and other proteins Egg, 27 mg per 2 oz serving. Red meat, 7 mg per 4 oz serving. Chicken, 17 mg per 4 oz serving. Fish, cod, or trout, 20 mg per 4 oz serving. Dairy Cream cheese, regular, 14 mg per 1 Tbsp serving. Brie cheese, 50 mg per 1 oz serving. Parmesan cheese, 70 mg per 1 Tbsp serving. The items listed above may not be a complete list of foods lower in calcium. Actual amounts of calcium may be different depending on processing. Contact a dietitian for more information. Summary Calcium is an important mineral in the body because it affects many functions. Getting enough calcium helps support strong bones and teeth throughout your life. Try to get most of your calcium from food. Calcium supplements may interact with certain medicines. Check with your health care provider or dietitian before starting any calcium supplements. This information is not intended to replace advice given to you by your health care provider. Make sure you discuss any questions you have with your health care provider. Document Revised: 07/01/2019 Document Reviewed: 07/01/2019 Elsevier Patient Education  2024 [employer].

## 2022-10-24 ENCOUNTER — Other Ambulatory Visit: Payer: Self-pay | Admitting: Obstetrics and Gynecology

## 2022-10-24 MED ORDER — METRONIDAZOLE 500 MG PO TABS: 500.0000 mg | ORAL_TABLET | Freq: Two times a day (BID) | ORAL | 0 refills | Status: DC

## 2022-11-21 NOTE — Progress Notes (Signed)
GYNECOLOGY  VISIT   HPI: 47 y.o.   Married  Caucasian  female   [phone] with Robin Meadows's last menstrual period was 06/22/2019.   here for   colposcopy for vaginal lesion noted at annual exam 10/22/22.  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   Treated fro BV in August, 2024.  She notes some odor and vulvar discharge.    GYNECOLOGIC HISTORY: Robin Meadows's last menstrual period was 06/22/2019. Contraception:  hyst Menopausal hormone therapy:  n/a Last mammogram:  08/17/22 Breast Density Cat B, BI-RADS CAT 1 neg Last pap smear:   10/22/22 LSIL: HR HPV positive, 05/19/19 neg: HR HPV neg        OB History     Gravida  3   Para  1   Term  1   Preterm      AB  2   Living  1      SAB  2   IAB      Ectopic      Multiple      Live Births                 Robin Meadows Active Problem List   Diagnosis Date Noted   Status post laparoscopic hysterectomy 07/14/2019    Past Medical History:  Diagnosis Date   Abnormal uterine bleeding    endometrial polyp   Anemia    Ovarian cyst    left   Vertigo     Past Surgical History:  Procedure Laterality Date   ABDOMINAL HYSTERECTOMY     CYSTOSCOPY N/A 07/14/2019   Procedure: CYSTOSCOPY;  Surgeon: Patton Salles, MD;  Location: [facility];  Service: Gynecology;  Laterality: N/A;   TOTAL LAPAROSCOPIC HYSTERECTOMY WITH SALPINGECTOMY Bilateral 07/14/2019   Procedure: TOTAL LAPAROSCOPIC HYSTERECTOMY WITH BILATERAL SALPINGECTOMY W/ VAGINAL REMOVAL OF ENDOMETRIAL POLYP AND SUTURE OF VAGINAL LACERATION;  Surgeon: Patton Salles, MD;  Location: [facility];  Service: Gynecology;  Laterality: Bilateral;   WISDOM TOOTH EXTRACTION      Current Outpatient Medications  Medication Sig Dispense Refill   Multiple Vitamin (MULTIVITAMIN WITH MINERALS) TABS tablet Take 1 tablet by mouth daily.     nystatin ointment (MYCOSTATIN) Apply 1 Application topically 2 (two) times daily. 30 g 0   triamcinolone  ointment (KENALOG) 0.5 % Apply 1 Application topically 2 (two) times daily. 30 g 0   No current facility-administered medications for this visit.     ALLERGIES: Robin Meadows has no known allergies.  Family History  Problem Relation Age of Onset   Cancer Mother        ?cervical ca--dec age 63   Colon cancer Father        at age of 38   Diabetes Maternal Grandmother    Hypertension Maternal Grandfather    Cancer Maternal Grandfather        dec Lung cancer   Liver cancer Maternal Uncle     Social History   Socioeconomic History   Marital status: Married    Spouse name: Not on file   Number of children: Not on file   Years of education: Not on file   Highest education level: Not on file  Occupational History   Not on file  Tobacco Use   Smoking status: Never   Smokeless tobacco: Never  Vaping Use   Vaping status: Never Used  Substance and Sexual Activity   Alcohol use: Never   Drug use: Never   Sexual  activity: Yes    Birth control/protection: Surgical    Comment: Hysterectomy  Other Topics Concern   Not on file  Social History Narrative   Not on file   Social Determinants of Health   Financial Resource Strain: Not on file  Food Insecurity: Not on file  Transportation Needs: Not on file  Physical Activity: Not on file  Stress: Not on file  Social Connections: Not on file  Intimate Partner Violence: Not on file    Review of Systems  All other systems reviewed and are negative.   PHYSICAL EXAMINATION:    BP 124/82 (BP Location: Right Arm, Robin Meadows Position: Sitting, Cuff Size: Normal)   Pulse 93   Ht 5' 9.25" (1.759 m)   Wt 300 lb (136.1 kg)   LMP 06/22/2019   SpO2 98%   BMI 43.98 kg/m     General appearance: alert, cooperative and appears stated age  Pelvic: External genitalia:  two skin tags versus condyloma right mons.              Urethra:  normal appearing urethra with no masses, tenderness or lesions              Bartholins and Skenes: normal                  Vagina:  White thick vaginal discharge at the apex. Two raised verrucous lesions of the vaginal cuff - midposterior and right apex.              Cervix: absent.    Colposcopy - cervix, vagina, and vulva.  Consent for procedure.  3% acetic acid used in vagina and on vulva. White light and green light filter used.  Findings:    Cervix:  absent Vagina:  raised verrucous lesions of the right posterior apex and right vaginal cuff.  Vulva:  two skin tags of the right mons pubis and raised verrucous lesion of right perineum.  Biopsies:  posterior vaginal cuff and right vaginal cuff.   Perineum prepped with hibiclens.  Local 1% lidocaine.  Lot [id number], exp Jul 2026.  3 mm punch biopsy used.  Tissue to pathology.  Silver nitrate placed.  Monsel's placed on vaginal bx sites. Minimal EBL. No complications.   Chaperone was present for exam:  Warren Lacy, CMA  ASSESSMENT  LGSIL of vagina. Positive HR HPV.  Vaginal lesions.  Vulvar lesion and skin tags versus condyloma.  STD screening.  Vaginal odor.  Just treated for BV.  Looks like yeast today.    PLAN  We discussed HPV.  FU biopsies.  STD screening.  Vaginitis testing.  Will treat with Diflucan 150 mg po x 1.  May repeat in 72 hours prn.   20  total time was spent for this Robin Meadows encounter, including preparation, face-to-face counseling with the Robin Meadows, coordination of care, and documentation of the encounter with respect to STD screening and tx of yeast infection in addition to doing the colposcopy and biopsies.

## 2022-12-05 ENCOUNTER — Ambulatory Visit (INDEPENDENT_AMBULATORY_CARE_PROVIDER_SITE_OTHER): Payer: Medicaid Other | Admitting: Obstetrics and Gynecology

## 2022-12-05 ENCOUNTER — Other Ambulatory Visit (HOSPITAL_COMMUNITY)
Admission: RE | Admit: 2022-12-05 | Discharge: 2022-12-05 | Disposition: A | Discharge status: Home / Self Care | Payer: Medicaid Other | Source: Ambulatory Visit | Attending: Obstetrics and Gynecology | Admitting: Obstetrics and Gynecology

## 2022-12-05 ENCOUNTER — Encounter: Payer: Self-pay | Admitting: Obstetrics and Gynecology

## 2022-12-05 VITALS — BP 124/82 | HR 93 | Ht 69.25 in | Wt 300.0 lb

## 2022-12-05 DIAGNOSIS — N898 Other specified noninflammatory disorders of vagina: Secondary | ICD-10-CM | POA: Insufficient documentation

## 2022-12-05 DIAGNOSIS — R87811 Vaginal high risk human papillomavirus (HPV) DNA test positive: Secondary | ICD-10-CM | POA: Diagnosis present

## 2022-12-05 DIAGNOSIS — Z113 Encounter for screening for infections with a predominantly sexual mode of transmission: Secondary | ICD-10-CM

## 2022-12-05 DIAGNOSIS — R87622 Low grade squamous intraepithelial lesion on cytologic smear of vagina (LGSIL): Secondary | ICD-10-CM | POA: Diagnosis present

## 2022-12-05 DIAGNOSIS — Z1159 Encounter for screening for other viral diseases: Secondary | ICD-10-CM

## 2022-12-05 DIAGNOSIS — Z114 Encounter for screening for human immunodeficiency virus [HIV]: Secondary | ICD-10-CM

## 2022-12-05 DIAGNOSIS — N9089 Other specified noninflammatory disorders of vulva and perineum: Secondary | ICD-10-CM

## 2022-12-05 MED ORDER — FLUCONAZOLE 150 MG PO TABS: 150.0000 mg | ORAL_TABLET | Freq: Once | ORAL | 0 refills | Status: AC

## 2022-12-05 NOTE — Patient Instructions (Signed)
Colposcopy, Care After  The following information offers guidance on how to care for yourself after your procedure. Your health care provider may also give you more specific instructions. If you have problems or questions, contact your health care provider. What can I expect after the procedure? If you had a colposcopy without a biopsy, you can expect to feel fine right away after your procedure. However, you may have some spotting of blood for a few days. You can return to your normal activities. If you had a colposcopy with a biopsy, it is common after the procedure to have: Soreness and mild pain. These may last for a few days. Mild vaginal bleeding or discharge that is dark-colored and grainy. This may last for a few days. The discharge may be caused by a liquid (solution) that was used during the procedure. You may need to wear a sanitary pad during this time. Spotting of blood for at least 48 hours after the procedure. Follow these instructions at home: Medicines Take over-the-counter and prescription medicines only as told by your health care provider. Talk with your health care provider about what type of over-the-counter pain medicines and prescription medicines you can start to take again. It is especially important to talk with your health care provider if you take blood thinners. Activity Avoid using douche products, using tampons, and having sex for at least 3 days after the procedure or for as long as told by your health care provider. Return to your normal activities as told by your health care provider. Ask your health care provider what activities are safe for you. General instructions Ask your health care provider if you may take baths, swim, or use a hot tub. You may take showers. If you use birth control (contraception), continue to use it. Keep all follow-up visits. This is important. Contact a health care provider if: You have a fever or chills. You faint or feel  light-headed. Get help right away if: You have heavy bleeding from your vagina or pass blood clots. Heavy bleeding is bleeding that soaks through a sanitary pad in less than 1 hour. You have vaginal discharge that is abnormal, is yellow in color, or smells bad. This could be a sign of infection. You have severe pain or cramps in your lower abdomen that do not go away with medicine. Summary If you had a colposcopy without a biopsy, you can expect to feel fine right away, but you may have some spotting of blood for a few days. You can return to your normal activities. If you had a colposcopy with a biopsy, it is common to have mild pain for a few days and spotting for 48 hours after the procedure. Avoid using douche products, using tampons, and having sex for at least 3 days after the procedure or for as long as told by your health care provider. Get help right away if you have heavy bleeding, severe pain, or signs of infection. This information is not intended to replace advice given to you by your health care provider. Make sure you discuss any questions you have with your health care provider. Document Revised: 07/31/2020 Document Reviewed: 07/31/2020 Elsevier Patient Education  2024 [employer].

## 2022-12-06 LAB — CERVICOVAGINAL ANCILLARY ONLY
Bacterial Vaginitis (gardnerella): NEGATIVE
Candida Glabrata: NEGATIVE
Candida Vaginitis: NEGATIVE
Chlamydia: NEGATIVE
Comment: NEGATIVE
Comment: NEGATIVE
Comment: NEGATIVE
Comment: NEGATIVE
Comment: NEGATIVE
Comment: NORMAL
Neisseria Gonorrhea: NEGATIVE
Trichomonas: NEGATIVE

## 2022-12-07 LAB — HEPATITIS C ANTIBODY: Hepatitis C Ab: NONREACTIVE

## 2022-12-07 LAB — RPR: RPR Ser Ql: NONREACTIVE

## 2022-12-07 LAB — HIV ANTIBODY (ROUTINE TESTING W REFLEX): HIV 1&2 Ab, 4th Generation: NONREACTIVE

## 2022-12-10 LAB — SURGICAL PATHOLOGY

## 2022-12-12 ENCOUNTER — Encounter: Payer: Self-pay | Admitting: Obstetrics and Gynecology

## 2023-07-10 ENCOUNTER — Encounter

## 2023-10-26 ENCOUNTER — Telehealth: Payer: Self-pay | Admitting: Obstetrics and Gynecology

## 2023-10-26 NOTE — Telephone Encounter (Signed)
 Please contact patient to schedule her annual exam and pap recall.   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

## 2023-11-25 ENCOUNTER — Other Ambulatory Visit: Payer: Self-pay | Admitting: Obstetrics and Gynecology

## 2023-11-25 DIAGNOSIS — Z1231 Encounter for screening mammogram for malignant neoplasm of breast: Secondary | ICD-10-CM

## 2023-11-27 NOTE — Telephone Encounter (Signed)
 Patient read my chart message

## 2023-12-04 ENCOUNTER — Encounter

## 2023-12-04 DIAGNOSIS — Z1231 Encounter for screening mammogram for malignant neoplasm of breast: Secondary | ICD-10-CM

## 2023-12-11 ENCOUNTER — Ambulatory Visit
Admission: RE | Admit: 2023-12-11 | Discharge: 2023-12-11 | Disposition: A | Discharge status: Home / Self Care | Source: Ambulatory Visit | Attending: Obstetrics and Gynecology | Admitting: Obstetrics and Gynecology

## 2023-12-11 DIAGNOSIS — Z1231 Encounter for screening mammogram for malignant neoplasm of breast: Secondary | ICD-10-CM

## 2023-12-15 ENCOUNTER — Ambulatory Visit: Payer: Self-pay | Admitting: Obstetrics and Gynecology

## 2024-02-21 ENCOUNTER — Ambulatory Visit: Payer: Self-pay | Admitting: Physician Assistant

## 2024-02-21 ENCOUNTER — Inpatient Hospital Stay: Admission: RE | Admit: 2024-02-21 | Discharge: 2024-02-21 | Payer: Self-pay

## 2024-02-21 ENCOUNTER — Ambulatory Visit
Admission: RE | Admit: 2024-02-21 | Discharge: 2024-02-21 | Disposition: A | Discharge status: Home / Self Care | Attending: Physician Assistant | Admitting: Physician Assistant

## 2024-02-21 ENCOUNTER — Ambulatory Visit (HOSPITAL_BASED_OUTPATIENT_CLINIC_OR_DEPARTMENT_OTHER)
Admission: RE | Admit: 2024-02-21 | Discharge: 2024-02-21 | Disposition: A | Source: Home / Self Care | Attending: Physician Assistant | Admitting: Physician Assistant

## 2024-02-21 DIAGNOSIS — M79605 Pain in left leg: Secondary | ICD-10-CM | POA: Diagnosis not present

## 2024-02-21 DIAGNOSIS — M79604 Pain in right leg: Secondary | ICD-10-CM | POA: Diagnosis not present

## 2024-02-21 DIAGNOSIS — M7989 Other specified soft tissue disorders: Secondary | ICD-10-CM | POA: Diagnosis not present

## 2024-02-21 NOTE — ED Triage Notes (Signed)
 Patient reports having swelling in both ankles since the 27th of November. She reports no accident or injury. Current PCP but last appt: > 1 yr.

## 2024-02-21 NOTE — Discharge Instructions (Signed)
 I will contact you if any of your blood work is abnormal.  Keep your legs elevated and use compression stockings.  Follow-up with primary care if your symptoms are not improving.  I have ordered an ultrasound to make sure that there is not a blood clot.  Please go to the following address at 11:15 this morning.  If you develop any chest pain, shortness of breath, headache, increasing swelling you need to be seen immediately.  [address] floor [place], [place] [address] Check-in at 11:15 AM

## 2024-02-21 NOTE — ED Provider Notes (Signed)
 [facility]    CSN: [id number] Arrival date & time: 02/21/24  9056      History   Chief Complaint Chief Complaint  Patient presents with   Ankle Pain    HPI Robin Meadows is a 48 y.o. female.   Patient seen today with a several week history of bilateral leg edema that has worsened on the right causing associated pain.  She reports that the pain is rated 7 on a 0-10 pain scale, described as aching, localized to lower legs bilaterally but worse on the right, no alleviating factors identified.  She has tried elevation, compression stockings, decreasing her sodium without improvement of symptoms.  She denies any history of chronic liver or kidney disease, heart failure, thyroid condition.  She denies any recent medication changes.  Denies associated chest pain, shortness of breath, palpitations, weakness.  She denies history of VTE event but is concerned because of the persistent swelling and discomfort.  She denies any recent hospitalization, immobilization, exogenous hormone use, active malignancy, recent COVID diagnosis.  She has not fallen or injured herself.  She is able to ambulate unassisted.    Past Medical History:  Diagnosis Date   Abnormal uterine bleeding    endometrial polyp   Anemia    Ovarian cyst    left   Status post laparoscopic hysterectomy 07/14/2019   VAIN I (vaginal intraepithelial neoplasia grade I) 2024   Vertigo    VIN I (vulvar intraepithelial neoplasia I) 2024    There are no active problems to display for this patient.   Past Surgical History:  Procedure Laterality Date   ABDOMINAL HYSTERECTOMY     CYSTOSCOPY N/A 07/14/2019   Procedure: CYSTOSCOPY;  Surgeon: Cathlyn JAYSON Nikki Bobie FORBES, MD;  Location: [facility];  Service: Gynecology;  Laterality: N/A;   TOTAL LAPAROSCOPIC HYSTERECTOMY WITH SALPINGECTOMY Bilateral 07/14/2019   Procedure: TOTAL LAPAROSCOPIC HYSTERECTOMY WITH BILATERAL SALPINGECTOMY W/ VAGINAL  REMOVAL OF ENDOMETRIAL POLYP AND SUTURE OF VAGINAL LACERATION;  Surgeon: Cathlyn JAYSON Nikki Bobie FORBES, MD;  Location: [facility];  Service: Gynecology;  Laterality: Bilateral;   WISDOM TOOTH EXTRACTION      OB History     Gravida  3   Para  1   Term  1   Preterm      AB  2   Living  1      SAB  2   IAB      Ectopic      Multiple      Live Births               Home Medications    Prior to Admission medications   Medication Sig Start Date End Date Taking? Authorizing Provider  Multiple Vitamin (MULTIVITAMIN WITH MINERALS) TABS tablet Take 1 tablet by mouth daily.    [provider]  nystatin  ointment (MYCOSTATIN ) Apply 1 Application topically 2 (two) times daily. 10/05/21   Chrzanowski, Jami B, NP  triamcinolone  ointment (KENALOG ) 0.5 % Apply 1 Application topically 2 (two) times daily. 10/05/21   Chrzanowski, Shasta NOVAK, NP    Family History Family History  Problem Relation Age of Onset   Cancer Mother        ?cervical ca--dec age 40   Colon cancer Father        at age of 27   Diabetes Maternal Grandmother    Hypertension Maternal Grandfather    Cancer Maternal Grandfather  dec Lung cancer   Liver cancer Maternal Uncle     Social History Social History   Tobacco Use   Smoking status: Never   Smokeless tobacco: Never  Vaping Use   Vaping status: Never Used  Substance Use Topics   Alcohol use: Never   Drug use: Never     Allergies   Patient has no known allergies.   Review of Systems Review of Systems  Constitutional:  Positive for activity change. Negative for appetite change, fatigue and fever.  Respiratory:  Negative for shortness of breath.   Cardiovascular:  Positive for leg swelling. Negative for chest pain and palpitations.  Gastrointestinal:  Negative for nausea and vomiting.  Musculoskeletal:  Positive for myalgias. Negative for arthralgias.  Skin:  Negative for color change and wound.     Physical  Exam Triage Vital Signs ED Triage Vitals  Encounter Vitals Group     BP 02/21/24 1012 115/78     Girls Systolic BP Percentile --      Girls Diastolic BP Percentile --      Boys Systolic BP Percentile --      Boys Diastolic BP Percentile --      Pulse Rate 02/21/24 1012 78     Resp 02/21/24 1012 20     Temp 02/21/24 1012 98.4 F (36.9 C)     Temp Source 02/21/24 1012 Oral     SpO2 02/21/24 1012 97 %     Weight 02/21/24 1011 298 lb (135.2 kg)     Height 02/21/24 1011 5' 11 (1.803 m)     Head Circumference --      Peak Flow --      Pain Score 02/21/24 1009 7     Pain Loc --      Pain Education --      Exclude from Growth Chart --    No data found.  Updated Vital Signs BP 115/78 (BP Location: Right Arm)   Pulse 78   Temp 98.4 F (36.9 C) (Oral)   Resp 20   Ht 5' 11 (1.803 m)   Wt 298 lb (135.2 kg)   LMP 06/22/2019   SpO2 97%   BMI 41.56 kg/m   Visual Acuity Right Eye Distance:   Left Eye Distance:   Bilateral Distance:    Right Eye Near:   Left Eye Near:    Bilateral Near:     Physical Exam Vitals reviewed.  Constitutional:      General: She is awake. She is not in acute distress.    Appearance: Normal appearance. She is well-developed. She is not ill-appearing.     Comments: Very pleasant female appears stated age in no acute distress sitting comfortably in exam room  HENT:     Head: Normocephalic and atraumatic.  Cardiovascular:     Rate and Rhythm: Normal rate and regular rhythm.     Heart sounds: Normal heart sounds, S1 normal and S2 normal. No murmur heard.    Comments: Negative Toula' sign bilaterally. Pulmonary:     Effort: Pulmonary effort is normal.     Breath sounds: Normal breath sounds. No wheezing, rhonchi or rales.     Comments: Clear to auscultation bilaterally Abdominal:     General: Bowel sounds are normal.     Palpations: Abdomen is soft.     Tenderness: There is no abdominal tenderness. There is no right CVA tenderness, left CVA  tenderness, guarding or rebound.  Musculoskeletal:     Right lower leg:  2+ Edema present.     Left lower leg: 1+ Edema present.  Psychiatric:        Behavior: Behavior is cooperative.      UC Treatments / Results  Labs (all labs ordered are listed, but only abnormal results are displayed) Labs Reviewed  CBC WITH DIFFERENTIAL/PLATELET  COMPREHENSIVE METABOLIC PANEL WITH GFR  TSH  POCT URINE DIPSTICK    EKG   Radiology No results found.  Procedures Procedures (including critical care time)  Medications Ordered in UC Medications - No data to display  Initial Impression / Assessment and Plan / UC Course  I have reviewed the triage vital signs and the nursing notes.  Pertinent labs & imaging results that were available during my care of the patient were reviewed by me and considered in my medical decision making (see chart for details).     Patient is well-appearing, afebrile, nontoxic, nontachycardic.  No negation for plain films as patient denies any recent trauma and has no focal bony tenderness.  I suspect her symptoms are related to dependent edema, however, as they have not responded to conservative treatment measures I did obtain basic blood work including CBC, CMP, thyroid.  She was encouraged to keep her legs elevated and use compression stockings for symptom relief.  She does have slightly increased pitting edema on the right compared to the left and reports significant pain with no past medical history to explain the edema and so I did agree to obtain an ultrasound to rule out DVT though I have a low suspicion for this.  Patient will go today at 11:15 for ultrasound and we will contact her if this is positive we will need to arrange treatment.  We discussed that if anything worsens and she has shortness of breath, chest pain, weakness, lightheadedness that she needs to go to the ER.  All questions were answered to patient satisfaction.  She expressed understanding and  agreement to treatment plan.  Final Clinical Impressions(s) / UC Diagnoses   Final diagnoses:  Leg swelling  Pain in both lower extremities     Discharge Instructions      I will contact you if any of your blood work is abnormal.  Keep your legs elevated and use compression stockings.  Follow-up with primary care if your symptoms are not improving.  I have ordered an ultrasound to make sure that there is not a blood clot.  Please go to the following address at 11:15 this morning.  If you develop any chest pain, shortness of breath, headache, increasing swelling you need to be seen immediately.  [address] floor [place], [place] [address] Check-in at 11:15 AM     ED Prescriptions   None    PDMP not reviewed this encounter.   Sherrell Rocky POUR, PA-C 02/21/24 1049

## 2024-02-22 LAB — CBC WITH DIFFERENTIAL/PLATELET
Basophils Absolute: 0 x10E3/uL (ref 0.0–0.2)
Basos: 1 %
EOS (ABSOLUTE): 0.2 x10E3/uL (ref 0.0–0.4)
Eos: 3 %
Hematocrit: 41 % (ref 34.0–46.6)
Hemoglobin: 12.7 g/dL (ref 11.1–15.9)
Immature Grans (Abs): 0 x10E3/uL (ref 0.0–0.1)
Immature Granulocytes: 0 %
Lymphocytes Absolute: 1.7 x10E3/uL (ref 0.7–3.1)
Lymphs: 23 %
MCH: 27.4 pg (ref 26.6–33.0)
MCHC: 31 g/dL — ABNORMAL LOW (ref 31.5–35.7)
MCV: 88 fL (ref 79–97)
Monocytes Absolute: 0.3 x10E3/uL (ref 0.1–0.9)
Monocytes: 5 %
Neutrophils Absolute: 5 x10E3/uL (ref 1.4–7.0)
Neutrophils: 67 %
Platelets: 339 x10E3/uL (ref 150–450)
RBC: 4.64 x10E6/uL (ref 3.77–5.28)
RDW: 13.2 % (ref 11.7–15.4)
WBC: 7.3 x10E3/uL (ref 3.4–10.8)

## 2024-02-22 LAB — TSH: TSH: 1.96 u[IU]/mL (ref 0.450–4.500)

## 2024-02-22 LAB — COMPREHENSIVE METABOLIC PANEL WITH GFR
ALT: 5 IU/L (ref 0–32)
AST: 14 IU/L (ref 0–40)
Albumin: 4.1 g/dL (ref 3.9–4.9)
Alkaline Phosphatase: 103 IU/L (ref 41–116)
BUN/Creatinine Ratio: 19 (ref 9–23)
BUN: 15 mg/dL (ref 6–24)
Bilirubin Total: 0.4 mg/dL (ref 0.0–1.2)
CO2: 24 mmol/L (ref 20–29)
Calcium: 9.4 mg/dL (ref 8.7–10.2)
Chloride: 102 mmol/L (ref 96–106)
Creatinine, Ser: 0.77 mg/dL (ref 0.57–1.00)
Globulin, Total: 2.8 g/dL (ref 1.5–4.5)
Glucose: 94 mg/dL (ref 70–99)
Potassium: 4.5 mmol/L (ref 3.5–5.2)
Sodium: 140 mmol/L (ref 134–144)
Total Protein: 6.9 g/dL (ref 6.0–8.5)
eGFR: 95 mL/min/1.73 (ref >59)

## 2024-03-17 ENCOUNTER — Telehealth: Payer: Self-pay

## 2024-03-17 NOTE — Telephone Encounter (Signed)
 Attempted to reach patient concerning colonoscopy recall; unable to speak with patient;  left message and number to the office for patient to call back and schedule appts;

## 2024-07-22 ENCOUNTER — Ambulatory Visit: Admitting: Obstetrics and Gynecology
